# Patient Record
Sex: Male | Born: 1960 | Race: Black or African American | Hispanic: No | Marital: Married | Smoking: Current every day smoker
Health system: Southern US, Community
[De-identification: ages and names within clinical notes are randomized; demographics above are authoritative.]

## PROBLEM LIST (undated history)

## (undated) DIAGNOSIS — M1990 Unspecified osteoarthritis, unspecified site: Secondary | ICD-10-CM

## (undated) DIAGNOSIS — Z8739 Personal history of other diseases of the musculoskeletal system and connective tissue: Secondary | ICD-10-CM

## (undated) DIAGNOSIS — I10 Essential (primary) hypertension: Secondary | ICD-10-CM

## (undated) DIAGNOSIS — M19071 Primary osteoarthritis, right ankle and foot: Secondary | ICD-10-CM

## (undated) DIAGNOSIS — K219 Gastro-esophageal reflux disease without esophagitis: Secondary | ICD-10-CM

## (undated) DIAGNOSIS — J45909 Unspecified asthma, uncomplicated: Secondary | ICD-10-CM

## (undated) DIAGNOSIS — I4891 Unspecified atrial fibrillation: Secondary | ICD-10-CM

## (undated) DIAGNOSIS — Z8709 Personal history of other diseases of the respiratory system: Secondary | ICD-10-CM

## (undated) DIAGNOSIS — Z969 Presence of functional implant, unspecified: Secondary | ICD-10-CM

## (undated) HISTORY — PX: CLOSED REDUCTION FOOT DISLOCATION: SHX954

## (undated) HISTORY — DX: Unspecified atrial fibrillation: I48.91

## (undated) HISTORY — DX: Personal history of other diseases of the respiratory system: Z87.09

## (undated) HISTORY — PX: FOOT SURGERY: SHX648

## (undated) HISTORY — PX: NOSE SURGERY: SHX723

---

## 1988-06-01 HISTORY — PX: ORIF FOOT FRACTURE: SHX2123

## 2015-06-02 HISTORY — PX: FOOT HARDWARE REMOVAL: SHX1661

## 2016-09-09 ENCOUNTER — Other Ambulatory Visit: Payer: Self-pay | Admitting: Orthopedic Surgery

## 2016-09-09 DIAGNOSIS — Z981 Arthrodesis status: Secondary | ICD-10-CM

## 2016-09-14 ENCOUNTER — Other Ambulatory Visit: Payer: Self-pay

## 2016-09-15 ENCOUNTER — Other Ambulatory Visit: Payer: Self-pay

## 2016-09-16 ENCOUNTER — Ambulatory Visit
Admission: RE | Admit: 2016-09-16 | Discharge: 2016-09-16 | Disposition: A | Discharge status: Home / Self Care | Payer: Medicare Other | Source: Ambulatory Visit | Attending: Orthopedic Surgery | Admitting: Orthopedic Surgery

## 2016-09-16 DIAGNOSIS — Z981 Arthrodesis status: Secondary | ICD-10-CM

## 2016-09-29 DIAGNOSIS — M19071 Primary osteoarthritis, right ankle and foot: Secondary | ICD-10-CM

## 2016-09-29 DIAGNOSIS — Z969 Presence of functional implant, unspecified: Secondary | ICD-10-CM

## 2016-09-29 HISTORY — DX: Presence of functional implant, unspecified: Z96.9

## 2016-09-29 HISTORY — DX: Primary osteoarthritis, right ankle and foot: M19.071

## 2016-10-21 ENCOUNTER — Other Ambulatory Visit: Payer: Self-pay | Admitting: Orthopedic Surgery

## 2016-10-23 ENCOUNTER — Encounter (HOSPITAL_BASED_OUTPATIENT_CLINIC_OR_DEPARTMENT_OTHER): Payer: Self-pay | Admitting: *Deleted

## 2016-10-23 NOTE — Pre-Procedure Instructions (Signed)
No EKG performed at [facility], per fax.

## 2016-10-23 NOTE — Pre-Procedure Instructions (Signed)
[facility]

## 2016-10-23 NOTE — Progress Notes (Signed)
   10/23/16 1348  OBSTRUCTIVE SLEEP APNEA  Have you ever been diagnosed with sleep apnea through a sleep study? No  Do you snore loudly (loud enough to be heard through closed doors)?  1  Do you often feel tired, fatigued, or sleepy during the daytime (such as falling asleep during driving or talking to someone)? 0  Has anyone observed you stop breathing during your sleep? 0  Do you have, or are you being treated for high blood pressure? 1  BMI more than 35 kg/m2? 1  Age > 50 (1-yes) 1  Male Gender (Yes=1) 1  Obstructive Sleep Apnea Score 5

## 2016-10-29 ENCOUNTER — Encounter (HOSPITAL_BASED_OUTPATIENT_CLINIC_OR_DEPARTMENT_OTHER)
Admission: RE | Disposition: A | Discharge status: Home / Self Care | Payer: Self-pay | Source: Ambulatory Visit | Attending: Orthopedic Surgery

## 2016-10-29 ENCOUNTER — Encounter (HOSPITAL_BASED_OUTPATIENT_CLINIC_OR_DEPARTMENT_OTHER): Payer: Self-pay | Admitting: *Deleted

## 2016-10-29 ENCOUNTER — Ambulatory Visit (HOSPITAL_BASED_OUTPATIENT_CLINIC_OR_DEPARTMENT_OTHER)
Admission: RE | Admit: 2016-10-29 | Discharge: 2016-10-30 | Disposition: A | Discharge status: Home / Self Care | Payer: Medicare Other | Source: Ambulatory Visit | Attending: Orthopedic Surgery | Admitting: Orthopedic Surgery

## 2016-10-29 ENCOUNTER — Ambulatory Visit (HOSPITAL_BASED_OUTPATIENT_CLINIC_OR_DEPARTMENT_OTHER): Payer: Medicare Other | Admitting: Certified Registered"

## 2016-10-29 DIAGNOSIS — M19071 Primary osteoarthritis, right ankle and foot: Secondary | ICD-10-CM | POA: Diagnosis not present

## 2016-10-29 DIAGNOSIS — I10 Essential (primary) hypertension: Secondary | ICD-10-CM | POA: Diagnosis not present

## 2016-10-29 DIAGNOSIS — T8484XA Pain due to internal orthopedic prosthetic devices, implants and grafts, initial encounter: Secondary | ICD-10-CM | POA: Diagnosis present

## 2016-10-29 DIAGNOSIS — Z87891 Personal history of nicotine dependence: Secondary | ICD-10-CM | POA: Insufficient documentation

## 2016-10-29 DIAGNOSIS — Z9889 Other specified postprocedural states: Secondary | ICD-10-CM

## 2016-10-29 DIAGNOSIS — Z79899 Other long term (current) drug therapy: Secondary | ICD-10-CM | POA: Diagnosis not present

## 2016-10-29 DIAGNOSIS — J45909 Unspecified asthma, uncomplicated: Secondary | ICD-10-CM | POA: Diagnosis not present

## 2016-10-29 DIAGNOSIS — K219 Gastro-esophageal reflux disease without esophagitis: Secondary | ICD-10-CM | POA: Diagnosis not present

## 2016-10-29 HISTORY — DX: Personal history of other diseases of the musculoskeletal system and connective tissue: Z87.39

## 2016-10-29 HISTORY — DX: Gastro-esophageal reflux disease without esophagitis: K21.9

## 2016-10-29 HISTORY — DX: Presence of functional implant, unspecified: Z96.9

## 2016-10-29 HISTORY — DX: Unspecified osteoarthritis, unspecified site: M19.90

## 2016-10-29 HISTORY — PX: FOOT ARTHRODESIS: SHX1655

## 2016-10-29 HISTORY — DX: Essential (primary) hypertension: I10

## 2016-10-29 HISTORY — DX: Primary osteoarthritis, right ankle and foot: M19.071

## 2016-10-29 HISTORY — DX: Unspecified asthma, uncomplicated: J45.909

## 2016-10-29 HISTORY — PX: HARDWARE REMOVAL: SHX979

## 2016-10-29 SURGERY — REMOVAL, HARDWARE
Anesthesia: General | Site: Foot | Laterality: Right

## 2016-10-29 MED ORDER — ONDANSETRON HCL 4 MG/2ML IJ SOLN
INTRAMUSCULAR | Status: DC | PRN
  Administered 2016-10-29: 4 mg via INTRAVENOUS

## 2016-10-29 MED ORDER — FENTANYL CITRATE (PF) 100 MCG/2ML IJ SOLN
INTRAMUSCULAR | Status: AC
  Filled 2016-10-29: qty 2

## 2016-10-29 MED ORDER — CEFAZOLIN SODIUM-DEXTROSE 2-4 GM/100ML-% IV SOLN
INTRAVENOUS | Status: AC
  Filled 2016-10-29: qty 100

## 2016-10-29 MED ORDER — ALBUTEROL SULFATE HFA 108 (90 BASE) MCG/ACT IN AERS
2.0000 | INHALATION_SPRAY | RESPIRATORY_TRACT | Status: DC | PRN
  Administered 2016-10-29: 2 via RESPIRATORY_TRACT

## 2016-10-29 MED ORDER — DEXAMETHASONE SODIUM PHOSPHATE 10 MG/ML IJ SOLN
INTRAMUSCULAR | Status: DC | PRN
  Administered 2016-10-29: 10 mg via INTRAVENOUS

## 2016-10-29 MED ORDER — SODIUM CHLORIDE 0.9 % IV SOLN
INTRAVENOUS | Status: DC
  Administered 2016-10-29: 13:00:00 via INTRAVENOUS

## 2016-10-29 MED ORDER — OXYCODONE HCL 5 MG PO TABS
5.0000 mg | ORAL_TABLET | ORAL | Status: DC | PRN
  Administered 2016-10-29 – 2016-10-30 (×7): 10 mg via ORAL
  Filled 2016-10-29 (×7): qty 2

## 2016-10-29 MED ORDER — MIDAZOLAM HCL 2 MG/2ML IJ SOLN
1.0000 mg | INTRAMUSCULAR | Status: DC | PRN
  Administered 2016-10-29: 2 mg via INTRAVENOUS

## 2016-10-29 MED ORDER — DOCUSATE SODIUM 100 MG PO CAPS
100.0000 mg | ORAL_CAPSULE | Freq: Two times a day (BID) | ORAL | Status: DC
  Filled 2016-10-29: qty 1

## 2016-10-29 MED ORDER — FENTANYL CITRATE (PF) 100 MCG/2ML IJ SOLN
25.0000 ug | INTRAMUSCULAR | Status: DC | PRN
  Administered 2016-10-29: 50 ug via INTRAVENOUS

## 2016-10-29 MED ORDER — ONDANSETRON HCL 4 MG PO TABS: 4.0000 mg | ORAL_TABLET | Freq: Four times a day (QID) | ORAL | Status: DC | PRN

## 2016-10-29 MED ORDER — LIDOCAINE HCL (CARDIAC) 20 MG/ML IV SOLN
INTRAVENOUS | Status: DC | PRN
  Administered 2016-10-29: 60 mg via INTRAVENOUS

## 2016-10-29 MED ORDER — BUPIVACAINE-EPINEPHRINE (PF) 0.5% -1:200000 IJ SOLN
INTRAMUSCULAR | Status: AC
  Filled 2016-10-29: qty 30

## 2016-10-29 MED ORDER — DOCUSATE SODIUM 100 MG PO CAPS: 100.0000 mg | ORAL_CAPSULE | Freq: Two times a day (BID) | ORAL | 0 refills | Status: AC

## 2016-10-29 MED ORDER — SENNA 8.6 MG PO TABS
1.0000 | ORAL_TABLET | Freq: Two times a day (BID) | ORAL | Status: DC
  Filled 2016-10-29: qty 1

## 2016-10-29 MED ORDER — SENNA 8.6 MG PO TABS: 2.0000 | ORAL_TABLET | Freq: Two times a day (BID) | ORAL | 0 refills | Status: AC

## 2016-10-29 MED ORDER — MIDAZOLAM HCL 2 MG/2ML IJ SOLN
INTRAMUSCULAR | Status: AC
  Filled 2016-10-29: qty 2

## 2016-10-29 MED ORDER — MORPHINE SULFATE (PF) 4 MG/ML IV SOLN
INTRAVENOUS | Status: AC
  Filled 2016-10-29: qty 1

## 2016-10-29 MED ORDER — ASPIRIN EC 81 MG PO TBEC: 81.0000 mg | DELAYED_RELEASE_TABLET | Freq: Two times a day (BID) | ORAL | 0 refills | Status: DC

## 2016-10-29 MED ORDER — CHLORHEXIDINE GLUCONATE 4 % EX LIQD: 60.0000 mL | Freq: Once | CUTANEOUS | Status: DC

## 2016-10-29 MED ORDER — LACTATED RINGERS IV SOLN
INTRAVENOUS | Status: DC
  Administered 2016-10-29 (×2): via INTRAVENOUS

## 2016-10-29 MED ORDER — PROPOFOL 10 MG/ML IV BOLUS
INTRAVENOUS | Status: DC | PRN
  Administered 2016-10-29: 200 mg via INTRAVENOUS

## 2016-10-29 MED ORDER — ONDANSETRON HCL 4 MG/2ML IJ SOLN: 4.0000 mg | Freq: Four times a day (QID) | INTRAMUSCULAR | Status: DC | PRN

## 2016-10-29 MED ORDER — CEFAZOLIN SODIUM-DEXTROSE 2-4 GM/100ML-% IV SOLN
2.0000 g | INTRAVENOUS | Status: AC
  Administered 2016-10-29: 2 g via INTRAVENOUS

## 2016-10-29 MED ORDER — 0.9 % SODIUM CHLORIDE (POUR BTL) OPTIME
TOPICAL | Status: DC | PRN
  Administered 2016-10-29: 120 mL

## 2016-10-29 MED ORDER — ALBUTEROL SULFATE HFA 108 (90 BASE) MCG/ACT IN AERS
INHALATION_SPRAY | RESPIRATORY_TRACT | Status: AC
  Filled 2016-10-29: qty 6.7

## 2016-10-29 MED ORDER — MORPHINE SULFATE (PF) 2 MG/ML IV SOLN
2.0000 mg | INTRAVENOUS | Status: DC | PRN
  Administered 2016-10-29 (×2): 2 mg via INTRAVENOUS

## 2016-10-29 MED ORDER — PROMETHAZINE HCL 25 MG/ML IJ SOLN: 6.2500 mg | INTRAMUSCULAR | Status: DC | PRN

## 2016-10-29 MED ORDER — FENTANYL CITRATE (PF) 100 MCG/2ML IJ SOLN
50.0000 ug | INTRAMUSCULAR | Status: DC | PRN
  Administered 2016-10-29: 50 ug via INTRAVENOUS

## 2016-10-29 MED ORDER — OXYCODONE HCL 5 MG PO TABS: 5.0000 mg | ORAL_TABLET | ORAL | 0 refills | Status: DC | PRN

## 2016-10-29 MED ORDER — SODIUM CHLORIDE 0.9 % IV SOLN: INTRAVENOUS | Status: DC

## 2016-10-29 MED ORDER — BUPIVACAINE-EPINEPHRINE (PF) 0.5% -1:200000 IJ SOLN
INTRAMUSCULAR | Status: DC | PRN
  Administered 2016-10-29: 30 mL via PERINEURAL

## 2016-10-29 MED ORDER — SCOPOLAMINE 1 MG/3DAYS TD PT72: 1.0000 | MEDICATED_PATCH | Freq: Once | TRANSDERMAL | Status: DC | PRN

## 2016-10-29 SURGICAL SUPPLY — 78 items
BANDAGE ACE 4X5 VEL STRL LF (GAUZE/BANDAGES/DRESSINGS) IMPLANT
BANDAGE ESMARK 6X9 LF (GAUZE/BANDAGES/DRESSINGS) IMPLANT
BENZOIN TINCTURE PRP APPL 2/3 (GAUZE/BANDAGES/DRESSINGS) IMPLANT
BIT DRILL 4.8X200 CANN (BIT) ×4 IMPLANT
BIT DRILL 5/64X5 DISP (BIT) ×4 IMPLANT
BLADE AVERAGE 25MMX9MM (BLADE)
BLADE AVERAGE 25X9 (BLADE) IMPLANT
BLADE MICRO SAGITTAL (BLADE) IMPLANT
BLADE SURG 15 STRL LF DISP TIS (BLADE) ×6 IMPLANT
BLADE SURG 15 STRL SS (BLADE) ×6
BNDG COHESIVE 4X5 TAN STRL (GAUZE/BANDAGES/DRESSINGS) ×4 IMPLANT
BNDG COHESIVE 6X5 TAN STRL LF (GAUZE/BANDAGES/DRESSINGS) ×4 IMPLANT
BNDG ESMARK 4X9 LF (GAUZE/BANDAGES/DRESSINGS) IMPLANT
BNDG ESMARK 6X9 LF (GAUZE/BANDAGES/DRESSINGS)
BONE GRAFT AUGMENT 3CC (Orthopedic Implant) ×4 IMPLANT
CHLORAPREP W/TINT 26ML (MISCELLANEOUS) ×4 IMPLANT
CLOSURE WOUND 1/2 X4 (GAUZE/BANDAGES/DRESSINGS)
COVER BACK TABLE 60X90IN (DRAPES) ×4 IMPLANT
CUFF TOURNIQUET SINGLE 34IN LL (TOURNIQUET CUFF) ×4 IMPLANT
DECANTER SPIKE VIAL GLASS SM (MISCELLANEOUS) IMPLANT
DRAPE EXTREMITY T 121X128X90 (DRAPE) ×4 IMPLANT
DRAPE OEC MINIVIEW 54X84 (DRAPES) ×4 IMPLANT
DRAPE SURG 17X23 STRL (DRAPES) IMPLANT
DRAPE U-SHAPE 47X51 STRL (DRAPES) ×4 IMPLANT
DRSG MEPITEL 4X7.2 (GAUZE/BANDAGES/DRESSINGS) ×4 IMPLANT
DRSG PAD ABDOMINAL 8X10 ST (GAUZE/BANDAGES/DRESSINGS) ×8 IMPLANT
ELECT REM PT RETURN 9FT ADLT (ELECTROSURGICAL) ×4
ELECTRODE REM PT RTRN 9FT ADLT (ELECTROSURGICAL) ×2 IMPLANT
GAUZE SPONGE 4X4 12PLY STRL (GAUZE/BANDAGES/DRESSINGS) ×4 IMPLANT
GLOVE BIO SURGEON STRL SZ8 (GLOVE) ×4 IMPLANT
GLOVE BIOGEL PI IND STRL 7.0 (GLOVE) ×6 IMPLANT
GLOVE BIOGEL PI IND STRL 8 (GLOVE) ×4 IMPLANT
GLOVE BIOGEL PI INDICATOR 7.0 (GLOVE) ×6
GLOVE BIOGEL PI INDICATOR 8 (GLOVE) ×4
GLOVE ECLIPSE 6.5 STRL STRAW (GLOVE) ×8 IMPLANT
GLOVE ECLIPSE 8.0 STRL XLNG CF (GLOVE) ×4 IMPLANT
GOWN STRL REUS W/ TWL LRG LVL3 (GOWN DISPOSABLE) ×2 IMPLANT
GOWN STRL REUS W/ TWL XL LVL3 (GOWN DISPOSABLE) ×6 IMPLANT
GOWN STRL REUS W/TWL LRG LVL3 (GOWN DISPOSABLE) ×2
GOWN STRL REUS W/TWL XL LVL3 (GOWN DISPOSABLE) ×6
K-WIRE 9  SMOOTH .062 (WIRE) IMPLANT
NDL SAFETY ECLIPSE 18X1.5 (NEEDLE) IMPLANT
NEEDLE HYPO 18GX1.5 SHARP (NEEDLE)
NEEDLE HYPO 22GX1.5 SAFETY (NEEDLE) ×4 IMPLANT
PACK BASIN DAY SURGERY FS (CUSTOM PROCEDURE TRAY) ×4 IMPLANT
PAD CAST 4YDX4 CTTN HI CHSV (CAST SUPPLIES) ×2 IMPLANT
PADDING CAST ABS 4INX4YD NS (CAST SUPPLIES)
PADDING CAST ABS COTTON 4X4 ST (CAST SUPPLIES) IMPLANT
PADDING CAST COTTON 4X4 STRL (CAST SUPPLIES) ×2
PADDING CAST COTTON 6X4 STRL (CAST SUPPLIES) ×4 IMPLANT
PENCIL BUTTON HOLSTER BLD 10FT (ELECTRODE) ×4 IMPLANT
PIN GUIDE DRILL TIP 2.8X300 (DRILL) ×8 IMPLANT
SANITIZER HAND PURELL 535ML FO (MISCELLANEOUS) ×4 IMPLANT
SCREW CANN FT 95X8 NS LNG (Screw) ×2 IMPLANT
SCREW CANNULATED 8.0X95 (Screw) ×2 IMPLANT
SHEET MEDIUM DRAPE 40X70 STRL (DRAPES) ×4 IMPLANT
SLEEVE SCD COMPRESS KNEE MED (MISCELLANEOUS) ×4 IMPLANT
SPLINT FAST PLASTER 5X30 (CAST SUPPLIES) ×40
SPLINT PLASTER CAST FAST 5X30 (CAST SUPPLIES) ×40 IMPLANT
SPONGE LAP 18X18 X RAY DECT (DISPOSABLE) ×4 IMPLANT
SPONGE SURGIFOAM ABS GEL 12-7 (HEMOSTASIS) IMPLANT
STOCKINETTE 6  STRL (DRAPES) ×2
STOCKINETTE 6 STRL (DRAPES) ×2 IMPLANT
STRIP CLOSURE SKIN 1/2X4 (GAUZE/BANDAGES/DRESSINGS) IMPLANT
SUCTION FRAZIER HANDLE 10FR (MISCELLANEOUS) ×2
SUCTION TUBE FRAZIER 10FR DISP (MISCELLANEOUS) ×2 IMPLANT
SUT ETHILON 3 0 PS 1 (SUTURE) ×8 IMPLANT
SUT MNCRL AB 3-0 PS2 18 (SUTURE) ×4 IMPLANT
SUT VIC AB 0 SH 27 (SUTURE) IMPLANT
SUT VIC AB 2-0 SH 27 (SUTURE) ×2
SUT VIC AB 2-0 SH 27XBRD (SUTURE) ×2 IMPLANT
SYR BULB 3OZ (MISCELLANEOUS) ×4 IMPLANT
SYR CONTROL 10ML LL (SYRINGE) IMPLANT
Screw Cannulated 8.0 x 105 MM (Screw) ×4 IMPLANT
TOWEL OR 17X24 6PK STRL BLUE (TOWEL DISPOSABLE) ×8 IMPLANT
TUBE CONNECTING 20'X1/4 (TUBING) ×1
TUBE CONNECTING 20X1/4 (TUBING) ×3 IMPLANT
UNDERPAD 30X30 (UNDERPADS AND DIAPERS) ×4 IMPLANT

## 2016-10-29 NOTE — Transfer of Care (Signed)
Immediate Anesthesia Transfer of Care Note  Patient: Frank May  Procedure(s) Performed: Procedure(s): Right ankle removal of deep implant (Right) Right subtalar arthrodesis (Right)  Patient Location: PACU  Anesthesia Type:GA combined with regional for post-op pain  Level of Consciousness: awake, alert , oriented and patient cooperative  Airway & Oxygen Therapy: Patient Spontanous Breathing and Patient connected to face mask oxygen  Post-op Assessment: Report given to RN and Post -op Vital signs reviewed and stable  Post vital signs: Reviewed and stable  Last Vitals:  Vitals:   10/29/16 0914 10/29/16 0915  BP:    Pulse: (!) 57 (!) 57  Resp: 13 13  Temp:      Last Pain:  Vitals:   10/29/16 0804  TempSrc: Oral  PainSc: 5       Patients Stated Pain Goal: 3 (10/29/16 0804)  Complications: No apparent anesthesia complications

## 2016-10-29 NOTE — H&P (Signed)
Frank CadetCharles A May is an 56 y.o. male.   Chief Complaint:  Right foot pain HPI:  56 y/o male with h/o right ankle and subtalar arthrodes[place] in the [place] past in [place]  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  He has failed non op treatment to date and presents today for removal of the hardware and revision subtalar arthrodesis.   Past Medical History:  Diagnosis Date  . Arthritis    right knee, right hip, right shoulder  . 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 Asthma   . GERD (gastroesophageal reflux disease)   . History of gout    right knee  . Hypertension    states under control with med., has been on med. > 20 yr.  . 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 CLOSED REDUCTION FOOT DISLOCATION Left   . FOOT HARDWARE REMOVAL Right 2017  . FOOT SURGERY Right    5-6 x  . NOSE SURGERY     x 3  . ORIF FOOT FRACTURE Right 1990    History reviewed. No pertinent family history. Social History:  reports that he quit smoking about 2 weeks ago. He has never used smokeless tobacco. He reports that he drinks alcohol. He reports that he does not use drugs.  Allergies: No Known Allergies  Medications Prior to Admission  Medication Sig Dispense Refill  . albuterol (PROVENTIL HFA;VENTOLIN HFA) 108 (90 Base) MCG/ACT inhaler Inhale into the lungs every 6 (six) hours as needed for wheezing or shortness of breath.    . allopurinol (ZYLOPRIM) 100 MG tablet Take 100 mg by mouth daily.    Marland Kitchen. HYDROcodone-acetaminophen (NORCO) 10-325 MG tablet Take 1 tablet by mouth every 6 (six) hours as needed.    Marland Kitchen. lisinopril (PRINIVIL,ZESTRIL) 10 MG tablet Take 12.5 mg by mouth daily.    . ranitidine (ZANTAC) 150 MG tablet Take 150 mg by mouth daily.      No results found for this or any previous visit (from the past 48 hour(s)). No results found.  ROS  No recent f/c/n/v/wt loss  Blood pressure 127/78,  pulse (!) 57, temperature 97.8 F (36.6 C), temperature source Oral, resp. rate 13, height 5' 9.5" (1.765 m), weight 124.3 kg (274 lb), SpO2 100 %. Physical Exam  wn wd male in nad.  A and O x 4.  Mood and affect normal.  EOMI.  resp unlabored.  R foot with healed surgical incisions.  No signs of infection.  No lymphadenopathy.  TTP at the sinus tarsi.  Intact sens to LT at the dorsal and plantar foot.  Assessment/Plan R subtalar nonunion and retained hardware - to OR for removal of implants and revision subtalar arthrodesis.  The risks and benefits of the alternative treatment options have been discussed in detail.  The patient wishes to proceed with surgery and specifically understands risks of bleeding, infection, nerve damage, blood clots, need for additional surgery, amputation and death.   Toni ArthursHEWITT, Kyani Simkin, MD 10/29/2016, 9:40 AM

## 2016-10-29 NOTE — Anesthesia Postprocedure Evaluation (Signed)
Anesthesia Post Note  Patient: Jacqulynn CadetCharles A Dobesh  Procedure(s) Performed: Procedure(s) (LRB): Right ankle removal of deep implant (Right) Right subtalar arthrodesis (Right)     Patient location during evaluation: PACU Anesthesia Type: General Level of consciousness: awake and alert Pain management: pain level controlled Vital Signs Assessment: post-procedure vital signs reviewed and stable Respiratory status: spontaneous breathing, nonlabored ventilation, respiratory function stable and patient connected to nasal cannula oxygen Cardiovascular status: blood pressure returned to baseline and stable Postop Assessment: no signs of nausea or vomiting Anesthetic complications: no    Last Vitals:  Vitals:   10/29/16 1255 10/29/16 1400  BP: 132/86 114/74  Pulse: 62 69  Resp: 18 18  Temp: 36.1 C     Last Pain:  Vitals:   10/29/16 1400  TempSrc:   PainSc: 5                  Cecile HearingStephen Edward Gahel Safley

## 2016-10-29 NOTE — Discharge Instructions (Signed)
Frank ArthursJohn Hewitt, MD [facility]  Please read the following information regarding your care after surgery.  Medications  You only need a prescription for the narcotic pain medicine (ex. oxycodone, Percocet, Norco).  All of the other medicines listed below are available over the counter. X acetominophen (Tylenol) 650 mg every 4-6 hours as you need for minor pain X oxycodone as prescribed for moderate to severe pain X aleve 220 mg - 2 aleve twice daily with food as needed for pain   Narcotic pain medicine (ex. oxycodone, Percocet, Vicodin) will cause constipation.  To prevent this problem, take the following medicines while you are taking any pain medicine. X docusate sodium (Colace) 100 mg twice a day X senna (Senokot) 2 tablets twice a day  X To help prevent blood clots, take a baby aspirin (81 mg) twice a day after surgery.  You should also get up every hour while you are awake to move around.    Weight Bearing X Do not bear any weight on the operated leg or foot.  Cast / Splint / Dressing X Keep your splint or cast clean and dry.  Dont put anything (coat hanger, pencil, etc) down inside of it.  If it gets damp, use a hair dryer on the cool setting to dry it.  If it gets soaked, call the office to schedule an appointment for a cast change.   After your dressing, cast or splint is removed; you may shower, but do not soak or scrub the wound.  Allow the water to run over it, and then gently pat it dry.  Swelling It is normal for you to have swelling where you had surgery.  To reduce swelling and pain, keep your toes above your nose for at least 3 days after surgery.  It may be necessary to keep your foot or leg elevated for several weeks.  If it hurts, it should be elevated.  Follow Up Call my office at [phone] when you are discharged from the hospital or surgery center to schedule an appointment to be seen two weeks after surgery.  Call my office at [phone] if you  develop a fever >101.5 F, nausea, vomiting, bleeding from the surgical site or severe pain.

## 2016-10-29 NOTE — Progress Notes (Signed)
Assisted Dr. Turk with right, ultrasound guided, popliteal/saphenous block. Side rails up, monitors on throughout procedure. See vital signs in flow sheet. Tolerated Procedure well. 

## 2016-10-29 NOTE — Anesthesia Procedure Notes (Signed)
Anesthesia Regional Block: Popliteal block   Pre-Anesthetic Checklist: ,, timeout performed, Correct Patient, Correct Site, Correct Laterality, Correct Procedure, Correct Position, site marked, Risks and benefits discussed,  Surgical consent,  Pre-op evaluation,  At surgeon's request and post-op pain management  Laterality: Right  Prep: chloraprep       Needles:  Injection technique: Single-shot  Needle Type: Echogenic Needle     Needle Length: 9cm  Needle Gauge: 21     Additional Needles:   Procedures: ultrasound guided,,,,,,,,  Narrative:  Start time: 10/29/2016 9:08 AM End time: 10/29/2016 9:13 AM Injection made incrementally with aspirations every 5 mL.  Performed by: Personally  Anesthesiologist: Cecile HearingURK, STEPHEN EDWARD  Additional Notes: No pain on injection. No increased resistance to injection. Injection made in 5cc increments.  Good needle visualization.  Patient tolerated procedure well.  Combined right popliteal/saphenous nerve block.

## 2016-10-29 NOTE — Brief Op Note (Signed)
10/29/2016  11:49 AM  PATIENT:  Frank May  56 y.o. male  PRE-OPERATIVE DIAGNOSIS: 1.  Painful hardware right ankle  S/p TTC nail      2.  Right subtalar arthritis  POST-OPERATIVE DIAGNOSIS: same  Procedure(s): 1.  Removal of deep implants right tibia 2.  Removal of deep implant right calcaneus (separate incision) 3.  Right subtalar arthrodesis 4.  AP and lateral xrays of the right ankle 5.  AP, lateral and Harris heel xrays of the right foot  SURGEON:  Toni ArthursJohn Skylor Schnapp, MD  ASSISTANT:  Alfredo MartinezJustin Ollis, PA-C  ANESTHESIA:   General, regional  EBL:  minimal   TOURNIQUET:   Total Tourniquet Time Documented: Thigh (Right) - 76 minutes Total: Thigh (Right) - 76 minutes  COMPLICATIONS:  None apparent  DISPOSITION:  Extubated, awake and stable to recovery.  DICTATION ID:  [id number]

## 2016-10-29 NOTE — Anesthesia Preprocedure Evaluation (Signed)
Anesthesia Evaluation  Patient identified by MRN, date of birth, ID band Patient awake    Reviewed: Allergy & Precautions, NPO status , Patient's Chart, lab work & pertinent test results  Airway Mallampati: II  TM Distance: >3 FB Neck ROM: Full    Dental  (+) Teeth Intact, Dental Advisory Given   Pulmonary asthma , former smoker,    Pulmonary exam normal breath sounds clear to auscultation       Cardiovascular hypertension, Pt. on medications Normal cardiovascular exam Rhythm:Regular Rate:Normal     Neuro/Psych negative neurological ROS     GI/Hepatic Neg liver ROS, GERD  Medicated,  Endo/Other  negative endocrine ROSObesity   Renal/GU negative Renal ROS     Musculoskeletal  (+) Arthritis , Osteoarthritis,    Abdominal   Peds  Hematology negative hematology ROS (+)   Anesthesia Other Findings Day of surgery medications reviewed with the patient.  Reproductive/Obstetrics                             Anesthesia Physical Anesthesia Plan  ASA: III  Anesthesia Plan: General   Post-op Pain Management:  Regional for Post-op pain   Induction: Intravenous  Airway Management Planned: LMA  Additional Equipment:   Intra-op Plan:   Post-operative Plan: Extubation in OR  Informed Consent: I have reviewed the patients History and Physical, chart, labs and discussed the procedure including the risks, benefits and alternatives for the proposed anesthesia with the patient or authorized representative who has indicated his/her understanding and acceptance.   Dental advisory given  Plan Discussed with: CRNA  Anesthesia Plan Comments: (Risks/benefits of general anesthesia discussed with patient including risk of damage to teeth, lips, gum, and tongue, nausea/vomiting, allergic reactions to medications, and the possibility of heart attack, stroke and death.  All patient questions answered.   Patient wishes to proceed.)        Anesthesia Quick Evaluation

## 2016-10-29 NOTE — Anesthesia Procedure Notes (Signed)
Procedure Name: LMA Insertion Date/Time: 10/29/2016 10:08 AM Performed by: Jahrell Hamor D Pre-anesthesia Checklist: Patient identified, Emergency Drugs available, Suction available and Patient being monitored Patient Re-evaluated:Patient Re-evaluated prior to inductionOxygen Delivery Method: Circle system utilized Preoxygenation: Pre-oxygenation with 100% oxygen Intubation Type: IV induction Ventilation: Mask ventilation without difficulty LMA: LMA inserted LMA Size: 4.0 Number of attempts: 1 Airway Equipment and Method: Bite block Placement Confirmation: positive ETCO2 Tube secured with: Tape Dental Injury: Teeth and Oropharynx as per pre-operative assessment

## 2016-10-30 ENCOUNTER — Encounter (HOSPITAL_BASED_OUTPATIENT_CLINIC_OR_DEPARTMENT_OTHER): Payer: Self-pay | Admitting: Orthopedic Surgery

## 2016-10-30 DIAGNOSIS — T8484XA Pain due to internal orthopedic prosthetic devices, implants and grafts, initial encounter: Secondary | ICD-10-CM | POA: Diagnosis not present

## 2016-10-30 NOTE — Op Note (Signed)
NAME:  Frank May, Frank May              ACCOUNT NO.:  [id number]  MEDICAL RECORD NO.:  [id number]  LOCATION:                                 FACILITY:  PHYSICIAN:  Toni ArthursJohn Romelle Reiley, MD             DATE OF BIRTH:  DATE OF PROCEDURE:  10/29/2016 DATE OF DISCHARGE:                              OPERATIVE REPORT   PREOPERATIVE DIAGNOSES: 1. Painful hardware, right ankle; status post tibiotalocalcaneal     nailing. 2. Right subtalar arthritis.  POSTOPERATIVE DIAGNOSES: 1. Painful hardware, right ankle; status post tibiotalocalcaneal     nailing. 2. Right subtalar arthritis.  PROCEDURE: 1. Removal of deep implants from the right tibia. 2. Removal of deep implants from the right calcaneus through a     separate incision. 3. Right subtalar arthrodesis through a separate incision. 4. AP and lateral radiographs of the right ankle. 5. AP, lateral and Harris heel radiographs of the right foot.  SURGEON:  Toni ArthursJohn Chiana Wamser, MD.  ASSISTANT:  Alfredo MartinezJustin Ollis, PA-C.  ANESTHESIA:  General, regional.  ESTIMATED BLOOD LOSS:  Minimal.  TOURNIQUET TIME:  76 minutes at 250 mmHg.  COMPLICATIONS:  None apparent.  DISPOSITION:  Extubated, awake, and stable to recovery.  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  He underwent TTC nailing for this condition while living in [place] several years ago.  He has continued to have pain around the ankle.  CT scan shows solid fusion of the ankle joint with subtalar joint has not healed and appears to have considerable arthritis.  He presents today for removal of the painful implants and arthrodesis of the subtalar joint.  He understands the risks and benefits of the alternative treatment options and elects surgical treatment.  He specifically understands risks of bleeding, infection, nerve damage, blood clots, need for  additional surgery, continued pain, nonunion, amputation, and death.  He understands that smoking increases his risk of nonunion, blood clots, and infection.  He reports that he has quit smoking several weeks ago.  PROCEDURE IN DETAIL:  After preoperative consent was obtained and the correct operative site was identified, the patient was brought to the operating room and placed supine on the operating table.  General anesthesia was induced.  Preoperative antibiotics were administered. Surgical time-out was taken.  The right lower extremity was prepped and draped in standard sterile fashion with a tourniquet around the thigh. The extremity was exsanguinated and the tourniquet was inflated to 250 mmHg.  The medial screw heads were identified.  The skin was incised over the proximal screw.  Dissection was carried down through the subcutaneous tissues, and the screw was cleared of all soft tissue. Screw was removed in its entirety.  The more distal screw was removed in the same fashion.  Both wounds were irrigated and closed with nylon.  Attention was then turned to the plantar aspect of the foot.  A longitudinal incision was made at the insertion point of the nail. Dissection was carried down bluntly through the subcutaneous tissues to the plantar  aspect of the calcaneus.  Curettes were used to open the cortical bone at the site of the nail.  An extraction T-handle was inserted into the nail and threaded into place.  The nail was rotated to break it free of all soft tissues within the medullary canal.  Slap hammer was then attached.  The nail was then removed in its entirety. Wound was then irrigated copiously including the medullary canal.  Nylon sutures were used to close the skin incision.  Attention was then turned to the lateral aspect of the hindfoot.  A sinus tarsi incision was made and dissection was carried down through the skin and subcutaneous tissues.  Peroneal tendons were  identified and were protected.  Subtalar joint was opened.  There were degenerative changes noted, but cartilage was still present on both sides of the joint.  The joint was opened with lamina spreader and all the remaining cartilage and subchondral bone was removed with curettes and rongeurs. Wound was irrigated copiously.  A 3.5-mm drill bit was used to perforate subchondral bone on both sides of the joint leaving the resultant bone graft in place.  Augment bone graft substitute from [facility] was then inserted into the joint.  Joint was reduced.  Two 8.0 mm Biomet partially-threaded cannulated screws were then inserted across the subtalar joint.  Both were noted to have excellent purchase and compressed the arthrodesis site appropriately.  AP ankle, AP foot, lateral ankle, lateral foot, and Harris heel radiographs were obtained showing appropriate position and length of all hardware and appropriate reduction of the subtalar joint.  The lateral wound was then closed with Vicryl, Monocryl, and nylon.  The posterior incision was closed with nylon.  Sterile dressings were applied, followed by well-padded short- leg splint.  Tourniquet was released after application of the dressings at 76 minutes.  The patient was awakened from anesthesia and transported to the recovery room in stable condition.  FOLLOWUP PLAN:  The patient will be nonweightbearing on the right lower extremity.  He will follow up with me in the office in 2 weeks for suture removal.  He will take aspirin for DVT prophylaxis.  He will be observed overnight for pain control.  Alfredo Martinez, PA-C, was present and scrubbed for the duration of the case.  His assistance was essential in positioning the patient, prepping and draping, gaining and maintaining exposure, performing the operation, closing and dressing the wounds, and applying the splint.  RADIOGRAPHS:  AP foot, lateral foot, and Harris heel radiographs  showed appropriate position and length of all hardware and appropriate arthrodesis of the subtalar joint.  AP ankle and lateral ankle radiographs confirmed appropriate position of the screws and solid fusion of the tibiotalar joint.  No other acute injuries are noted.     Toni Arthurs, MD     JH/MEDQ  D:  10/29/2016  T:  10/29/2016  Job:  [id number]

## 2018-07-18 ENCOUNTER — Encounter (HOSPITAL_COMMUNITY): Payer: Self-pay

## 2018-07-18 ENCOUNTER — Other Ambulatory Visit: Payer: Self-pay

## 2018-07-18 ENCOUNTER — Inpatient Hospital Stay (HOSPITAL_COMMUNITY)
Admission: EM | Admit: 2018-07-18 | Discharge: 2018-07-20 | DRG: 193 | Disposition: A | Discharge status: Home / Self Care | Payer: Medicare Other | Attending: Internal Medicine | Admitting: Internal Medicine

## 2018-07-18 ENCOUNTER — Emergency Department (HOSPITAL_COMMUNITY): Payer: Medicare Other

## 2018-07-18 ENCOUNTER — Emergency Department (HOSPITAL_COMMUNITY)
Admission: EM | Admit: 2018-07-18 | Discharge: 2018-07-18 | Discharge status: Against Medical Advice | Payer: Medicare Other | Source: Home / Self Care | Attending: Emergency Medicine | Admitting: Emergency Medicine

## 2018-07-18 DIAGNOSIS — E876 Hypokalemia: Secondary | ICD-10-CM | POA: Diagnosis present

## 2018-07-18 DIAGNOSIS — I4891 Unspecified atrial fibrillation: Secondary | ICD-10-CM

## 2018-07-18 DIAGNOSIS — J449 Chronic obstructive pulmonary disease, unspecified: Secondary | ICD-10-CM | POA: Diagnosis not present

## 2018-07-18 DIAGNOSIS — F17200 Nicotine dependence, unspecified, uncomplicated: Secondary | ICD-10-CM | POA: Diagnosis present

## 2018-07-18 DIAGNOSIS — R109 Unspecified abdominal pain: Secondary | ICD-10-CM

## 2018-07-18 DIAGNOSIS — Z7982 Long term (current) use of aspirin: Secondary | ICD-10-CM

## 2018-07-18 DIAGNOSIS — J45909 Unspecified asthma, uncomplicated: Secondary | ICD-10-CM | POA: Insufficient documentation

## 2018-07-18 DIAGNOSIS — Z5329 Procedure and treatment not carried out because of patient's decision for other reasons: Secondary | ICD-10-CM

## 2018-07-18 DIAGNOSIS — G8929 Other chronic pain: Secondary | ICD-10-CM | POA: Diagnosis present

## 2018-07-18 DIAGNOSIS — Z79899 Other long term (current) drug therapy: Secondary | ICD-10-CM

## 2018-07-18 DIAGNOSIS — M1611 Unilateral primary osteoarthritis, right hip: Secondary | ICD-10-CM | POA: Diagnosis present

## 2018-07-18 DIAGNOSIS — Z9119 Patient's noncompliance with other medical treatment and regimen: Secondary | ICD-10-CM | POA: Diagnosis not present

## 2018-07-18 DIAGNOSIS — J189 Pneumonia, unspecified organism: Secondary | ICD-10-CM

## 2018-07-18 DIAGNOSIS — J440 Chronic obstructive pulmonary disease with acute lower respiratory infection: Secondary | ICD-10-CM | POA: Diagnosis present

## 2018-07-18 DIAGNOSIS — E785 Hyperlipidemia, unspecified: Secondary | ICD-10-CM | POA: Diagnosis present

## 2018-07-18 DIAGNOSIS — J181 Lobar pneumonia, unspecified organism: Secondary | ICD-10-CM

## 2018-07-18 DIAGNOSIS — I480 Paroxysmal atrial fibrillation: Secondary | ICD-10-CM | POA: Diagnosis present

## 2018-07-18 DIAGNOSIS — R0602 Shortness of breath: Secondary | ICD-10-CM

## 2018-07-18 DIAGNOSIS — Z87891 Personal history of nicotine dependence: Secondary | ICD-10-CM | POA: Insufficient documentation

## 2018-07-18 DIAGNOSIS — I10 Essential (primary) hypertension: Secondary | ICD-10-CM | POA: Insufficient documentation

## 2018-07-18 DIAGNOSIS — Z5320 Procedure and treatment not carried out because of patient's decision for unspecified reasons: Secondary | ICD-10-CM

## 2018-07-18 DIAGNOSIS — M109 Gout, unspecified: Secondary | ICD-10-CM | POA: Diagnosis present

## 2018-07-18 DIAGNOSIS — N179 Acute kidney failure, unspecified: Secondary | ICD-10-CM | POA: Diagnosis present

## 2018-07-18 DIAGNOSIS — I5021 Acute systolic (congestive) heart failure: Secondary | ICD-10-CM | POA: Diagnosis present

## 2018-07-18 DIAGNOSIS — M19011 Primary osteoarthritis, right shoulder: Secondary | ICD-10-CM | POA: Diagnosis present

## 2018-07-18 DIAGNOSIS — A419 Sepsis, unspecified organism: Secondary | ICD-10-CM | POA: Insufficient documentation

## 2018-07-18 DIAGNOSIS — I509 Heart failure, unspecified: Secondary | ICD-10-CM

## 2018-07-18 DIAGNOSIS — I110 Hypertensive heart disease with heart failure: Secondary | ICD-10-CM | POA: Diagnosis present

## 2018-07-18 DIAGNOSIS — D509 Iron deficiency anemia, unspecified: Secondary | ICD-10-CM | POA: Diagnosis present

## 2018-07-18 DIAGNOSIS — J9621 Acute and chronic respiratory failure with hypoxia: Secondary | ICD-10-CM | POA: Diagnosis present

## 2018-07-18 DIAGNOSIS — M1711 Unilateral primary osteoarthritis, right knee: Secondary | ICD-10-CM | POA: Diagnosis present

## 2018-07-18 DIAGNOSIS — K219 Gastro-esophageal reflux disease without esophagitis: Secondary | ICD-10-CM | POA: Diagnosis present

## 2018-07-18 LAB — CBC WITH DIFFERENTIAL/PLATELET
Abs Immature Granulocytes: 0.05 10*3/uL (ref 0.00–0.07)
Basophils Absolute: 0 10*3/uL (ref 0.0–0.1)
Basophils Relative: 0 %
Eosinophils Absolute: 0 10*3/uL (ref 0.0–0.5)
Eosinophils Relative: 0 %
HCT: 37.3 % — ABNORMAL LOW (ref 39.0–52.0)
Hemoglobin: 12.4 g/dL — ABNORMAL LOW (ref 13.0–17.0)
Immature Granulocytes: 0 %
Lymphocytes Relative: 11 %
Lymphs Abs: 1.4 10*3/uL (ref 0.7–4.0)
MCH: 23.7 pg — ABNORMAL LOW (ref 26.0–34.0)
MCHC: 33.2 g/dL (ref 30.0–36.0)
MCV: 71.3 fL — ABNORMAL LOW (ref 80.0–100.0)
Monocytes Absolute: 0.7 10*3/uL (ref 0.1–1.0)
Monocytes Relative: 6 %
NEUTROS PCT: 83 %
Neutro Abs: 10.4 10*3/uL — ABNORMAL HIGH (ref 1.7–7.7)
Platelets: 133 10*3/uL — ABNORMAL LOW (ref 150–400)
RBC: 5.23 MIL/uL (ref 4.22–5.81)
RDW: 15.3 % (ref 11.5–15.5)
WBC: 12.6 10*3/uL — AB (ref 4.0–10.5)
nRBC: 0 % (ref 0.0–0.2)

## 2018-07-18 LAB — COMPREHENSIVE METABOLIC PANEL
ALBUMIN: 3 g/dL — AB (ref 3.5–5.0)
ALT: 32 U/L (ref 0–44)
AST: 72 U/L — ABNORMAL HIGH (ref 15–41)
Alkaline Phosphatase: 51 U/L (ref 38–126)
Anion gap: 14 (ref 5–15)
BUN: 16 mg/dL (ref 6–20)
CO2: 18 mmol/L — ABNORMAL LOW (ref 22–32)
Calcium: 8.2 mg/dL — ABNORMAL LOW (ref 8.9–10.3)
Chloride: 102 mmol/L (ref 98–111)
Creatinine, Ser: 1.49 mg/dL — ABNORMAL HIGH (ref 0.61–1.24)
GFR calc Af Amer: 59 mL/min — ABNORMAL LOW (ref >60)
GFR calc non Af Amer: 51 mL/min — ABNORMAL LOW (ref >60)
Glucose, Bld: 109 mg/dL — ABNORMAL HIGH (ref 70–99)
POTASSIUM: 3.7 mmol/L (ref 3.5–5.1)
Sodium: 134 mmol/L — ABNORMAL LOW (ref 135–145)
Total Bilirubin: 1.6 mg/dL — ABNORMAL HIGH (ref 0.3–1.2)
Total Protein: 8.3 g/dL — ABNORMAL HIGH (ref 6.5–8.1)

## 2018-07-18 LAB — INFLUENZA PANEL BY PCR (TYPE A & B)
Influenza A By PCR: NEGATIVE
Influenza B By PCR: NEGATIVE

## 2018-07-18 LAB — LIPASE, BLOOD: Lipase: 15 U/L (ref 11–51)

## 2018-07-18 LAB — BRAIN NATRIURETIC PEPTIDE: B Natriuretic Peptide: 1433.6 pg/mL — ABNORMAL HIGH (ref 0.0–100.0)

## 2018-07-18 LAB — MAGNESIUM: Magnesium: 2 mg/dL (ref 1.7–2.4)

## 2018-07-18 LAB — TROPONIN I
Troponin I: 0.05 ng/mL (ref <0.03)
Troponin I: 0.03 ng/mL (ref <0.03)

## 2018-07-18 LAB — LACTIC ACID, PLASMA
LACTIC ACID, VENOUS: 1.4 mmol/L (ref 0.5–1.9)
Lactic Acid, Venous: 1.3 mmol/L (ref 0.5–1.9)

## 2018-07-18 MED ORDER — METRONIDAZOLE IN NACL 5-0.79 MG/ML-% IV SOLN
500.0000 mg | Freq: Three times a day (TID) | INTRAVENOUS | Status: DC
  Filled 2018-07-18: qty 100

## 2018-07-18 MED ORDER — AMOXICILLIN-POT CLAVULANATE 875-125 MG PO TABS: 1.0000 | ORAL_TABLET | Freq: Two times a day (BID) | ORAL | 0 refills | Status: AC

## 2018-07-18 MED ORDER — SODIUM CHLORIDE 0.9 % IV SOLN
2.0000 g | Freq: Once | INTRAVENOUS | Status: AC
  Administered 2018-07-18: 2 g via INTRAVENOUS
  Filled 2018-07-18: qty 2

## 2018-07-18 MED ORDER — AZITHROMYCIN 250 MG PO TABS
500.0000 mg | ORAL_TABLET | Freq: Once | ORAL | Status: AC
  Administered 2018-07-18: 500 mg via ORAL
  Filled 2018-07-18: qty 2

## 2018-07-18 MED ORDER — ALLOPURINOL 100 MG PO TABS
100.0000 mg | ORAL_TABLET | Freq: Every day | ORAL | Status: DC
  Administered 2018-07-19 – 2018-07-20 (×2): 100 mg via ORAL
  Filled 2018-07-18 (×2): qty 1

## 2018-07-18 MED ORDER — VANCOMYCIN HCL 10 G IV SOLR
2500.0000 mg | Freq: Once | INTRAVENOUS | Status: AC
  Administered 2018-07-18: 2500 mg via INTRAVENOUS
  Filled 2018-07-18: qty 2500

## 2018-07-18 MED ORDER — ASPIRIN EC 81 MG PO TBEC
81.0000 mg | DELAYED_RELEASE_TABLET | Freq: Every day | ORAL | Status: DC
  Administered 2018-07-18 – 2018-07-20 (×3): 81 mg via ORAL
  Filled 2018-07-18 (×3): qty 1

## 2018-07-18 MED ORDER — DOXYCYCLINE HYCLATE 100 MG PO CAPS: 100.0000 mg | ORAL_CAPSULE | Freq: Two times a day (BID) | ORAL | 0 refills | Status: AC

## 2018-07-18 MED ORDER — HYDROCODONE-ACETAMINOPHEN 10-325 MG PO TABS
1.0000 | ORAL_TABLET | Freq: Four times a day (QID) | ORAL | Status: DC | PRN
  Administered 2018-07-18 – 2018-07-19 (×2): 1 via ORAL
  Filled 2018-07-18 (×2): qty 1

## 2018-07-18 MED ORDER — IPRATROPIUM-ALBUTEROL 0.5-2.5 (3) MG/3ML IN SOLN
3.0000 mL | Freq: Three times a day (TID) | RESPIRATORY_TRACT | Status: DC
  Administered 2018-07-19 (×2): 3 mL via RESPIRATORY_TRACT
  Filled 2018-07-18 (×2): qty 3

## 2018-07-18 MED ORDER — VANCOMYCIN HCL IN DEXTROSE 1-5 GM/200ML-% IV SOLN: 1000.0000 mg | Freq: Once | INTRAVENOUS | Status: DC

## 2018-07-18 MED ORDER — SODIUM CHLORIDE 0.9 % IV SOLN
1.0000 g | INTRAVENOUS | Status: DC
  Administered 2018-07-19: 1 g via INTRAVENOUS
  Filled 2018-07-18: qty 10

## 2018-07-18 MED ORDER — DILTIAZEM LOAD VIA INFUSION
20.0000 mg | Freq: Once | INTRAVENOUS | Status: DC
  Filled 2018-07-18: qty 20

## 2018-07-18 MED ORDER — FAMOTIDINE 20 MG PO TABS: 20.0000 mg | ORAL_TABLET | Freq: Every day | ORAL | Status: DC

## 2018-07-18 MED ORDER — APIXABAN 5 MG PO TABS
5.0000 mg | ORAL_TABLET | Freq: Two times a day (BID) | ORAL | Status: DC
  Administered 2018-07-19 – 2018-07-20 (×3): 5 mg via ORAL
  Filled 2018-07-18 (×5): qty 1

## 2018-07-18 MED ORDER — SODIUM CHLORIDE 0.9 % IV BOLUS
1000.0000 mL | Freq: Once | INTRAVENOUS | Status: AC
  Administered 2018-07-18: 1000 mL via INTRAVENOUS

## 2018-07-18 MED ORDER — SODIUM CHLORIDE 0.9 % IV SOLN
500.0000 mg | INTRAVENOUS | Status: DC
  Filled 2018-07-18: qty 500

## 2018-07-18 MED ORDER — SODIUM CHLORIDE 0.9 % IV BOLUS: 1000.0000 mL | Freq: Once | INTRAVENOUS | Status: DC

## 2018-07-18 MED ORDER — PREGABALIN 75 MG PO CAPS
75.0000 mg | ORAL_CAPSULE | Freq: Three times a day (TID) | ORAL | Status: DC
  Administered 2018-07-18 – 2018-07-20 (×6): 75 mg via ORAL
  Filled 2018-07-18 (×6): qty 1

## 2018-07-18 MED ORDER — SODIUM CHLORIDE 0.9 % IV SOLN
1.0000 g | Freq: Once | INTRAVENOUS | Status: AC
  Administered 2018-07-18: 1 g via INTRAVENOUS
  Filled 2018-07-18: qty 10

## 2018-07-18 MED ORDER — ENOXAPARIN SODIUM 150 MG/ML ~~LOC~~ SOLN
1.0000 mg/kg | Freq: Once | SUBCUTANEOUS | Status: AC
  Administered 2018-07-18: 140 mg via SUBCUTANEOUS
  Filled 2018-07-18: qty 0.93

## 2018-07-18 MED ORDER — SODIUM CHLORIDE 0.9 % IV BOLUS
500.0000 mL | Freq: Once | INTRAVENOUS | Status: AC
  Administered 2018-07-18: 500 mL via INTRAVENOUS

## 2018-07-18 MED ORDER — DILTIAZEM HCL 25 MG/5ML IV SOLN
20.0000 mg | Freq: Once | INTRAVENOUS | Status: AC
  Administered 2018-07-18: 20 mg via INTRAVENOUS
  Filled 2018-07-18: qty 5

## 2018-07-18 MED ORDER — IPRATROPIUM-ALBUTEROL 0.5-2.5 (3) MG/3ML IN SOLN
3.0000 mL | Freq: Four times a day (QID) | RESPIRATORY_TRACT | Status: DC
  Administered 2018-07-18: 3 mL via RESPIRATORY_TRACT
  Filled 2018-07-18: qty 3

## 2018-07-18 MED ORDER — FUROSEMIDE 10 MG/ML IJ SOLN
40.0000 mg | INTRAMUSCULAR | Status: AC
  Administered 2018-07-18: 40 mg via INTRAVENOUS
  Filled 2018-07-18: qty 4

## 2018-07-18 NOTE — ED Notes (Addendum)
Pt state he must leave at 1400. ndeclines another iv. Dr. Erma Heritage aware and at bedside. Dr. Erma Heritage states leaving AMA may result in death or disablity. Pt voices understanding of same

## 2018-07-18 NOTE — ED Provider Notes (Signed)
[facility] EMERGENCY DEPARTMENT Provider Note   CSN: [id number] Arrival date & time: 07/18/18  1622    History   Chief Complaint Chief Complaint  Patient presents with  . pneumonia/re-turn for admit    HPI Frank May is a 58 y.o. male.     The patient is a ill-appearing 58 year old male, he has a known history of high blood pressure as well as asthma, he has had multiple surgeries to his feet.  He presents within the next 2 hours after leaving AGAINST MEDICAL ADVICE after being told that he likely had multifocal pneumonia new onset atrial fibrillation and congestive heart failure.  He states that he is coming back because he knows that he probably needs an overnight stay.  He still feels palpitations shortness of breath and is coughing.  The cough is dry and nonproductive, there is no fevers, he does have peripheral edema which is chronic left greater than right and attributable to his multiple prior surgeries.  He denies a known history of congestive heart failure or coronary artery disease.     Past Medical History:  Diagnosis Date  . Arthritis    right knee, right hip, right shoulder  . 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 Asthma   . GERD (gastroesophageal reflux disease)   . History of gout    right knee  . Hypertension    states under control with med., has been on med. > 20 yr.  . 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 S/P hardware removal 10/29/2016    Past Surgical History:  Procedure Laterality Date  . CLOSED REDUCTION FOOT DISLOCATION Left   . FOOT ARTHRODESIS Right 10/29/2016   Procedure: Right subtalar arthrodesis;  Surgeon: Toni Arthurs, MD;  Location: [facility];  Service: Orthopedics;  Laterality: Right;  . FOOT HARDWARE REMOVAL Right 2017  . FOOT SURGERY Right    5-6 x  . HARDWARE REMOVAL Right 10/29/2016   Procedure: Right ankle  removal of deep implant;  Surgeon: Toni Arthurs, MD;  Location: [facility];  Service: Orthopedics;  Laterality: Right;  . NOSE SURGERY     x 3  . ORIF FOOT FRACTURE Right 1990        Home Medications    Prior to Admission medications   Medication Sig Start Date End Date Taking? Authorizing Provider  albuterol (PROVENTIL HFA;VENTOLIN HFA) 108 (90 Base) MCG/ACT inhaler Inhale into the lungs every 6 (six) hours as needed for wheezing or shortness of breath.    [provider]  allopurinol (ZYLOPRIM) 100 MG tablet Take 100 mg by mouth daily.    [provider]  amoxicillin-clavulanate (AUGMENTIN) 875-125 MG tablet Take 1 tablet by mouth every 12 (twelve) hours for 10 days. 07/18/18 07/28/18  Shaune Pollack, MD  aspirin EC 81 MG tablet Take 1 tablet (81 mg total) by mouth 2 (two) times daily. 10/29/16   Jacinta Shoe, PA-C  docusate sodium (COLACE) 100 MG capsule Take 1 capsule (100 mg total) by mouth 2 (two) times daily. While taking narcotic pain medicine. Patient not taking: Reported on 07/18/2018 10/29/16   Jacinta Shoe, PA-C  doxycycline (VIBRAMYCIN) 100 MG capsule Take 1 capsule (100 mg total) by mouth 2 (two) times daily for 10 days. 07/18/18 07/28/18  Shaune Pollack, MD  lisinopril (PRINIVIL,ZESTRIL) 10 MG tablet Take 10 mg by mouth daily.  [provider]  oxyCODONE (ROXICODONE) 5 MG immediate release tablet Take 1-2 tablets (5-10 mg total) by mouth every 4 (four) hours as needed for moderate pain or severe pain. For no more than 3 days. 10/29/16   Jacinta Shoellis, Justin Pike, PA-C  pregabalin (LYRICA) 75 MG capsule Take 75 mg by mouth 3 (three) times daily. 07/11/18   [provider]  ranitidine (ZANTAC) 150 MG tablet Take 150 mg by mouth daily.    [provider]  senna (SENOKOT) 8.6 MG TABS tablet Take 2 tablets (17.2 mg total) by mouth 2 (two) times daily. Patient not taking: Reported on 07/18/2018 10/29/16   Jacinta Shoellis, Justin Pike,  PA-C    Family History No family history on file.  Social History Social History   Tobacco Use  . Smoking status: Former Smoker    Last attempt to quit: 10/09/2016    Years since quitting: 1.7  . Smokeless tobacco: Never Used  Substance Use Topics  . Alcohol use: Yes    Comment: occasionally  . Drug use: No     Allergies   Patient has no known allergies.   Review of Systems Review of Systems  All other systems reviewed and are negative.    Physical Exam Updated Vital Signs BP 135/73 (BP Location: Left Arm)   Pulse 73   Temp 99.1 F (37.3 C) (Oral)   Resp (!) 22   SpO2 100%   Physical Exam Vitals signs and nursing note reviewed.  Constitutional:      General: He is in acute distress.     Appearance: He is well-developed.  HENT:     Head: Normocephalic and atraumatic.     Mouth/Throat:     Pharynx: No oropharyngeal exudate.  Eyes:     General: No scleral icterus.       Right eye: No discharge.        Left eye: No discharge.     Conjunctiva/sclera: Conjunctivae normal.     Pupils: Pupils are equal, round, and reactive to light.  Neck:     Musculoskeletal: Normal range of motion and neck supple.     Thyroid: No thyromegaly.     Vascular: No JVD.  Cardiovascular:     Rate and Rhythm: Tachycardia present. Rhythm irregular.     Heart sounds: Normal heart sounds. No murmur. No friction rub. No gallop.   Pulmonary:     Effort: Pulmonary effort is normal. No respiratory distress.     Breath sounds: Rales present. No wheezing.  Abdominal:     General: Bowel sounds are normal. There is no distension.     Palpations: Abdomen is soft. There is no mass.     Tenderness: There is no abdominal tenderness.  Musculoskeletal: Normal range of motion.        General: No tenderness.     Right lower leg: Edema present.     Left lower leg: Edema present.  Lymphadenopathy:     Cervical: No cervical adenopathy.  Skin:    General: Skin is warm and dry.     Findings: No  erythema or rash.  Neurological:     Mental Status: He is alert.     Coordination: Coordination normal.  Psychiatric:        Behavior: Behavior normal.      ED Treatments / Results  Labs (all labs ordered are listed, but only abnormal results are displayed) Labs Reviewed - No data to display  EKG None  Radiology Ct Abdomen Pelvis  Wo Contrast  Result Date: 07/18/2018 CLINICAL DATA:  59 year old male with nausea, vomiting, diarrhea and frequent urination for the past week. Initial encounter. EXAM: CT ABDOMEN AND PELVIS WITHOUT CONTRAST TECHNIQUE: Multidetector CT imaging of the abdomen and pelvis was performed following the standard protocol without IV contrast. COMPARISON:  07/18/2018 chest x-ray. No comparison abdominal/pelvic CT. FINDINGS: Lower chest: Consolidation left lower lobe with multiple air bronchograms. Left lower lobe bronchial thickening. One of left lower lobe peripheral bronchi is occluded. Subcarinal adenopathy. Heart size top-normal. Trace left coronary artery calcification. Trace pericardial fluid. Hepatobiliary: Slightly lobular contour of the anterior margin liver without other findings of cirrhosis. Taking into account limitation by non contrast imaging, no worrisome hepatic lesion. No calcified gallstone or common bile duct stone. No CT evidence of gallbladder inflammation. Pancreas: Taking into account limitation by non contrast imaging, no worrisome pancreatic lesion or inflammation. Spleen: Taking into account limitation by non contrast imaging, no splenic mass or enlargement. Adrenals/Urinary Tract: No obstructing stone or hydronephrosis. Along the inferior aspect left kidney is a 1.2 cm calcification similar to 02/26/2016 plain film of the lumbar spine. 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 Taking into account limitation by non contrast imaging, no worrisome adrenal, right renal or urinary bladder lesion.  Stomach/Bowel: Sigmoid colon and descending colon diverticulosis. No extraluminal bowel inflammatory process noted. No inflammation surrounds the appendix. Portions of the stomach, small bowel and colon under distended slightly limiting evaluation. No obvious mass. Fluid within the rectosigmoid region may be related to patient's diarrhea. Vascular/Lymphatic: Atherosclerotic changes aorta and aortic branch vessels. No abdominal aortic aneurysm. Scattered normal size lymph nodes. Reproductive: Top-normal size prostate gland. Other: No free air or bowel containing hernia. Musculoskeletal: Degenerative changes lumbar spine most notable involving facet joints L4-5 level. Bony prominence iliac crest/right greater trochanter may be related to enthesopathy. IMPRESSION: 1. Consolidation left lower lobe with multiple air bronchograms. Left lower lobe bronchial thickening. One of left lower lobe peripheral bronchi is occluded. Subcarinal adenopathy. Follow-up until clearance recommended to exclude underlying lesion. 2. No obstructing stone or hydronephrosis. 3. Along the inferior aspect left kidney is a 1.2 cm calcification similar to 02/26/2016. This does not have peripheral calcification as is typically seen with a renal artery aneurysm. If further delineation is clinically desired, this could be assessed with dedicated CT angiogram of the abdomen. 4. Sigmoid colon and descending colon diverticulosis. 5. Fluid within the rectosigmoid region may be related to patient's history diarrhea. 6. Aortic Atherosclerosis (ICD10-I70.0). Trace coronary artery calcifications. Electronically Signed   By: Lacy Duverney M.D.   On: 07/18/2018 13:28   Dg Chest 2 View  Result Date: 07/18/2018 CLINICAL DATA:  Vomiting with diarrhea and frequent urination for a week. EXAM: CHEST - 2 VIEW COMPARISON:  None. FINDINGS: The heart size and mediastinal contours are normal. There is mild elevation of the right hemidiaphragm. There are diffuse  bilateral airspace opacities, left greater than right. The pulmonary vasculature is ill-defined. There may be a small amount of pleural fluid on the left. No pneumothorax. No acute osseous findings are evident. IMPRESSION: Diffuse left greater than right pulmonary airspace opacities, likely pulmonary edema. Cannot exclude early left lower lobe pneumonia. Electronically Signed   By: Carey Bullocks M.D.   On: 07/18/2018 12:17    Procedures .Critical Care Performed by: Eber Hong, MD Authorized by: Eber Hong, MD   Critical care provider statement:    Critical care time (minutes):  35  Critical care time was exclusive of:  Separately billable procedures and treating other patients and teaching time   Critical care was necessary to treat or prevent imminent or life-threatening deterioration of the following conditions:  Cardiac failure   Critical care was time spent personally by me on the following activities:  Blood draw for specimens, development of treatment plan with patient or surrogate, discussions with consultants, evaluation of patient's response to treatment, examination of patient, obtaining history from patient or surrogate, ordering and performing treatments and interventions, ordering and review of laboratory studies, ordering and review of radiographic studies, pulse oximetry, re-evaluation of patient's condition and review of old charts   (including critical care time)  Medications Ordered in ED Medications - No data to display   Initial Impression / Assessment and Plan / ED Course  I have reviewed the triage vital signs and the nursing notes.  Pertinent labs & imaging results that were available during my care of the patient were reviewed by me and considered in my medical decision making (see chart for details).        The patient is tachycardic, atrial fibrillation is present, chest x-ray shows multifocal infiltrates left greater than right consistent with either  CHF or pneumonia, at this time the patient will need to be admitted to the hospital, antibiotics, rate control.  Patient agreeabl  The patient is persistently tachycardic, he was given a shot of Lovenox to anticoagulate given the new onset A. fib, he does not know how long that is been going on.  Lasix was given as well as antibiotics, I discussed the care with Dr. Luberta Robertson of the hospitalist service who will admit.  The patient is critically ill with new onset A. fib with rapid ventricular rate and congestive heart failure.  Final Clinical Impressions(s) / ED Diagnoses   Final diagnoses:  Acute congestive heart failure, unspecified heart failure type (HCC)  Atrial fibrillation with rapid ventricular response (HCC)      Eber Hong, MD 07/18/18 1715

## 2018-07-18 NOTE — H&P (Addendum)
History and Physical:    Frank May   WGN:[id number] DOB: Mar 10, 1961 DOA: 07/18/2018  Referring MD/provider: Dr. Hyacinth MeekerMiller PCP: Domenick BookbinderHess, Kristin M, PA-C   Patient coming from: Home  Chief Complaint: nausea vomiting diarrhea for 5 days with fevers and chills.  History of Present Illness:   Frank May is an 58 y.o. male with past medical history significant for asthma and 60-pack-year tobacco history [place] was in his usual state of health until 5 days ago when he developed sudden onset of nausea and vomiting up to 5-6 times a day as well as diarrhea up to 5-6 times a day associated with fevers chills or malaise. Patient thinks he got this virus from the children who live at home with them. Patient notes that his symptoms resolved slowly and were resolved by yesterday however his fevers persisted and were associated with diaphoresis. Patient notes that he has had "total body infections" several times in the past and he was concerned that his body was infected.  Review of systems is positive for worsened DOEx 2 days which he attributes to asthma. He does admit to a cough for the past day which is nonproductive. He admits to some orthopnea for the past day. Denies any PND. Patient denies chest pain/discomfort. Patient denies palpitation or sensation of an irregular heart rate. He does have ongoing diaphoresis and fever.   ED Course:  On initial presentation earlier this morning, patient was noted to be hypoxic and have new onset atrial fibrillation with RVR. Chest x-ray revealed multifocal infiltrates. Patient did not want to stay and signed out AGAINST MEDICAL ADVICE and was discharged on Augmentin and doxycycline. However patient returned 3 hours later stating he felt he probably needed to be in the hospital for at least 1 day. He thinks his breathing is better since he got a DuoNeb.  ROS:   ROS   Review of Systems: Skin: No rashes, lesions,  wounds Eyes: no discharge, redness, pain HENT: no ear pain, hearing loss, drainage, tinnitus Endocrine:  no polyuria Respiratory: Positive cough,, shortness of breath, no hemoptysis Cardiovascular: No palpitations, chest pain GU: No dysuria, increased frequency CNS: No numbness, dizziness, headache Blood/lymphatics: No easy bruising, bleeding  Past Medical History:   Past Medical History:  Diagnosis Date  . Arthritis    right knee, right hip, right shoulder  . 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 Asthma   . GERD (gastroesophageal reflux disease)   . History of gout    right knee  . Hypertension    states under control with med., has been on med. > 20 yr.  . 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 CLOSED REDUCTION FOOT DISLOCATION Left   . FOOT ARTHRODESIS Right 10/29/2016   Procedure: Right subtalar arthrodesis;  Surgeon: Toni ArthursHewitt, John, MD;  Location: [facility];  Service: Orthopedics;  Laterality: Right;  . FOOT HARDWARE REMOVAL Right 2017  . FOOT SURGERY Right    5-6 x  . HARDWARE REMOVAL Right 10/29/2016   Procedure: Right ankle removal of deep implant;  Surgeon: Toni ArthursHewitt, John, MD;  Location: [facility];  Service: Orthopedics;  Laterality: Right;  . NOSE SURGERY     x 3  . ORIF FOOT FRACTURE Right 1990    Social History:   Social History   Socioeconomic History  .  Marital status: Married    Spouse name: Not on file  . Number of children: Not on file  . Years of education: Not on file  . Highest education level: Not on file  Occupational History  . Not on file  Social Needs  . Financial resource strain: Not on file  . Food insecurity:    Worry: Not on file    Inability: Not on file  . Transportation needs:    Medical: Not on file    Non-medical: Not on file  Tobacco Use  . Smoking status: Former Smoker    Last attempt to quit:  10/09/2016    Years since quitting: 1.7  . Smokeless tobacco: Never Used  Substance and Sexual Activity  . Alcohol use: Yes    Comment: occasionally  . Drug use: No  . Sexual activity: Not on file  Lifestyle  . Physical activity:    Days per week: Not on file    Minutes per session: Not on file  . Stress: Not on file  Relationships  . Social connections:    Talks on phone: Not on file    Gets together: Not on file    Attends religious service: Not on file    Active member of club or organization: Not on file    Attends meetings of clubs or organizations: Not on file    Relationship status: Not on file  . Intimate partner violence:    Fear of current or ex partner: Not on file    Emotionally abused: Not on file    Physically abused: Not on file    Forced sexual activity: Not on file  Other Topics Concern  . Not on file  Social History Narrative  . Not on file    Allergies   Patient has no known allergies.  Family history:   No family history on file.  Current Medications:   Prior to Admission medications   Medication Sig Start Date End Date Taking? Authorizing Provider  albuterol (PROVENTIL HFA;VENTOLIN HFA) 108 (90 Base) MCG/ACT inhaler Inhale 2 puffs into the lungs every 6 (six) hours as needed for wheezing or shortness of breath.    Yes [provider]  albuterol (PROVENTIL) (2.5 MG/3ML) 0.083% nebulizer solution Take 2.5 mg by nebulization 3 (three) times daily as needed for wheezing or shortness of breath.   Yes [provider]  allopurinol (ZYLOPRIM) 100 MG tablet Take 100 mg by mouth daily.   Yes [provider]  HYDROcodone-acetaminophen (NORCO) 10-325 MG tablet Take 1 tablet by mouth every 6 (six) hours as needed (for pain).   Yes [provider]  lisinopril (PRINIVIL,ZESTRIL) 10 MG tablet Take 10 mg by mouth daily.    Yes [provider]  pregabalin (LYRICA) 75 MG capsule Take 75 mg by mouth 3 (three) times daily.  07/11/18  Yes [provider]  amoxicillin-clavulanate (AUGMENTIN) 875-125 MG tablet Take 1 tablet by mouth every 12 (twelve) hours for 10 days. 07/18/18 07/28/18  Shaune Pollack, MD  aspirin EC 81 MG tablet Take 1 tablet (81 mg total) by mouth 2 (two) times daily. Patient not taking: Reported on 07/18/2018 10/29/16   Jacinta Shoe, PA-C  docusate sodium (COLACE) 100 MG capsule Take 1 capsule (100 mg total) by mouth 2 (two) times daily. While taking narcotic pain medicine. Patient not taking: Reported on 07/18/2018 10/29/16   Jacinta Shoe, PA-C  doxycycline (VIBRAMYCIN) 100 MG capsule Take 1 capsule (100 mg total) by mouth 2 (  two) times daily for 10 days. 07/18/18 07/28/18  Shaune Pollack, MD  oxyCODONE (ROXICODONE) 5 MG immediate release tablet Take 1-2 tablets (5-10 mg total) by mouth every 4 (four) hours as needed for moderate pain or severe pain. For no more than 3 days. Patient not taking: Reported on 07/18/2018 10/29/16   Jacinta Shoe, PA-C  ranitidine (ZANTAC) 150 MG tablet Take 150 mg by mouth daily.    [provider]  senna (SENOKOT) 8.6 MG TABS tablet Take 2 tablets (17.2 mg total) by mouth 2 (two) times daily. Patient not taking: Reported on 07/18/2018 10/29/16   Jacinta Shoe, [place]    Physical Exam:   Vitals:   07/18/18 1635 07/18/18 1800 07/18/18 1900 07/18/18 1948  BP: 135/73 138/87 103/65   Pulse: 73 65 (!) 39   Resp: (!) 22 (!) 26 (!) 29   Temp: 99.1 F (37.3 C)     TempSrc: Oral     SpO2: 100% 95% 95% 97%     Physical Exam: Blood pressure 103/65, pulse (!) 39, temperature 99.1 F (37.3 C), temperature source Oral, resp. rate (!) 29, SpO2 97 %. Gen: chronically ill-appearing man looking older than stated age sitting up in bed with mild tachypnea which she denies. Eyes: Sclerae anicteric. Conjunctiva mildly injected. Chest: poor air entry bilaterally with no wheezes but he does have coarse rales at the left base and scattered rhonchi in  bilateral lung fields. CV: Tachycardic, irregular. Abdomen: NABS, obese, soft, nondistended, nontender. No tenderness to light or deep palpation. No rebound, no guarding. Extremities: No edema.  Skin: Warm and dry. No rashes, lesions or wounds. Neuro: Alert and oriented times 3; grossly nonfocal. Psych: Patient is cooperative, logical and coherent with appropriate mood and affect.  Data Review:    Labs: Basic Metabolic Panel: Recent Labs  Lab 07/18/18 1047 07/18/18 1811  NA 134*  --   K 3.7  --   CL 102  --   CO2 18*  --   GLUCOSE 109*  --   BUN 16  --   CREATININE 1.49*  --   CALCIUM 8.2*  --   MG  --  2.0   Liver Function Tests: Recent Labs  Lab 07/18/18 1047  AST 72*  ALT 32  ALKPHOS 51  BILITOT 1.6*  PROT 8.3*  ALBUMIN 3.0*   Recent Labs  Lab 07/18/18 1047  LIPASE 15   No results for input(s): AMMONIA in the last 168 hours. CBC: Recent Labs  Lab 07/18/18 1047  WBC 12.6*  NEUTROABS 10.4*  HGB 12.4*  HCT 37.3*  MCV 71.3*  PLT 133*   Cardiac Enzymes: Recent Labs  Lab 07/18/18 1047 07/18/18 1811  TROPONINI 0.05* 0.03*    BNP (last 3 results) No results for input(s): PROBNP in the last 8760 hours. CBG: No results for input(s): GLUCAP in the last 168 hours.  Urinalysis No results found for: COLORURINE, APPEARANCEUR, LABSPEC, PHURINE, GLUCOSEU, HGBUR, BILIRUBINUR, KETONESUR, PROTEINUR, UROBILINOGEN, NITRITE, LEUKOCYTESUR    Radiographic Studies: Ct Abdomen Pelvis Wo Contrast  Result Date: 07/18/2018 CLINICAL DATA:  58 year old male with nausea, vomiting, diarrhea and frequent urination for the past week. Initial encounter. EXAM: CT ABDOMEN AND PELVIS WITHOUT CONTRAST TECHNIQUE: Multidetector CT imaging of the abdomen and pelvis was performed following the standard protocol without IV contrast. COMPARISON:  07/18/2018 chest x-ray. No comparison abdominal/pelvic CT. FINDINGS: Lower chest: Consolidation left lower lobe with multiple air  bronchograms. Left lower lobe bronchial thickening. One of left lower lobe  peripheral bronchi is occluded. Subcarinal adenopathy. Heart size top-normal. Trace left coronary artery calcification. Trace pericardial fluid. Hepatobiliary: Slightly lobular contour of the anterior margin liver without other findings of cirrhosis. Taking into account limitation by non contrast imaging, no worrisome hepatic lesion. No calcified gallstone or common bile duct stone. No CT evidence of gallbladder inflammation. Pancreas: Taking into account limitation by non contrast imaging, no worrisome pancreatic lesion or inflammation. Spleen: Taking into account limitation by non contrast imaging, no splenic mass or enlargement. Adrenals/Urinary Tract: No obstructing stone or hydronephrosis. Along the inferior aspect left kidney is a 1.2 cm calcification similar to 02/26/2016 plain film of the lumbar spine. 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 Taking into account limitation by non contrast imaging, no worrisome adrenal, right renal or urinary bladder lesion. Stomach/Bowel: Sigmoid colon and descending colon diverticulosis. No extraluminal bowel inflammatory process noted. No inflammation surrounds the appendix. Portions of the stomach, small bowel and colon under distended slightly limiting evaluation. No obvious mass. Fluid within the rectosigmoid region may be related to patient's diarrhea. Vascular/Lymphatic: Atherosclerotic changes aorta and aortic branch vessels. No abdominal aortic aneurysm. Scattered normal size lymph nodes. Reproductive: Top-normal size prostate gland. Other: No free air or bowel containing hernia. Musculoskeletal: Degenerative changes lumbar spine most notable involving facet joints L4-5 level. Bony prominence iliac crest/right greater trochanter may be related to enthesopathy. IMPRESSION: 1. Consolidation left lower lobe with multiple air bronchograms. Left  lower lobe bronchial thickening. One of left lower lobe peripheral bronchi is occluded. Subcarinal adenopathy. Follow-up until clearance recommended to exclude underlying lesion. 2. No obstructing stone or hydronephrosis. 3. Along the inferior aspect left kidney is a 1.2 cm calcification similar to 02/26/2016. This does not have peripheral calcification as is typically seen with a renal artery aneurysm. If further delineation is clinically desired, this could be assessed with dedicated CT angiogram of the abdomen. 4. Sigmoid colon and descending colon diverticulosis. 5. Fluid within the rectosigmoid region may be related to patient's history diarrhea. 6. Aortic Atherosclerosis (ICD10-I70.0). Trace coronary artery calcifications. Electronically Signed   By: Lacy DuverneySteven  Olson M.D.   On: 07/18/2018 13:28   Dg Chest 2 View  Result Date: 07/18/2018 CLINICAL DATA:  Vomiting with diarrhea and frequent urination for a week. EXAM: CHEST - 2 VIEW COMPARISON:  None. FINDINGS: The heart size and mediastinal contours are normal. There is mild elevation of the right hemidiaphragm. There are diffuse bilateral airspace opacities, left greater than right. The pulmonary vasculature is ill-defined. There may be a small amount of pleural fluid on the left. No pneumothorax. No acute osseous findings are evident. IMPRESSION: Diffuse left greater than right pulmonary airspace opacities, likely pulmonary edema. Cannot exclude early left lower lobe pneumonia. Electronically Signed   By: Carey BullocksWilliam  Veazey M.D.   On: 07/18/2018 12:17    EKG: Independently reviewed.  EKG done at 10 this morning: Atrial fibrillation at 115. Normal axis. No acute ST-T wave changes. She'll PVC. EKG done at 4:30 today after he returned: Atrial fibrillation at 130. Multiple PVCs. No acute ST-T wave changes.   Assessment/Plan:   Principal Problem:   Acute on chronic respiratory failure with hypoxia (HCC) Active Problems:   CAP (community acquired  pneumonia)   COPD (chronic obstructive pulmonary disease) (HCC)   Acute CHF (congestive heart failure) (HCC)   Essential hypertension  NEW HYPOXEMIA Patient with history of asthma/COPD comes in with new hypoxemia and infiltrates on chest x-ray. At this point is  unclear whether the infiltrates represent pneumonia versus pulmonary edema versus both. Patient has had fever for the past week associated with nausea vomiting and diarrhea. He also has new atrial fibrillation with elevated BNP. Patient states he's never been told he's had pulmonary edema in the past but has had "5 cases of walking pneumonia".  CAP I suspect patient has an infection likely bacterial Will continue ceftriaxone and azithromycin for likely community-acquired pneumonia.  CONGESTIVE HEART FAILURE  Suspect patient may also have some level of pulmonary edema given long history of hypertension with new onset A. Fib with RVR. He may well have some level of  HF PEF. Patient received Lasix 40 mg IV 1 in ED, urine output measurement is pending At this point I am not continuing any diuretics as I am not sure how much of his infiltration is secondary to pulmonary edema vs infection.  Could consider repeating chest x-ray in the morning to see if there is marked improvement in infiltrates after the Lasix. Echocardiogram ordered  NEW ATRIAL FIBRILLATION  Rate control with Cardizem and patient placed on Cardizem drip Cardizem can be switched to beta blocker once we have a better understanding of his cardiac function after echocardiogram Eliquis started in ED, will continue but CHADS Vasc2 score is 1 so this can possibly be discontinued.    COPD We will treat with DuoNeb's every 6 hours No wheezing on exam, will not start steroids at this point however would have low threshold for starting this should he develop wheezing.  HTN Will hold lisinopril given initiation of diltiazem Patient will likely need to be started on a beta  blocker and lisinopril restarted once we have a better understanding of cardiac function.  RENAL DYSFUNCTION Baseline renal function is unknown in this patient. We'll need to follow closely as we diurese him.  ONGOING TOBACCO USE Patient declines nicotine patch. Notes "don't tell me to quit smoking".  CHRONIC PAIN Continue home doses of Norco 10/325 and Lyrica  GOUT Continue allopurinol    Other information:   DVT prophylaxis: Lovenox ordered. Code Status: Full code. Family Communication: patient states his wife knows he is in the ED.  Disposition Plan: home Consults called: none Admission status: inpatient   The medical decision making on this patient was of high complexity and the patient is at high risk for clinical deterioration, therefore this is a level 3 visit.  Horatio Pel Tublu Inanna Telford [facility]  If 7PM-7AM, please contact night-coverage [email or web address] Password [facility] 07/18/2018, 7:57 PM

## 2018-07-18 NOTE — ED Triage Notes (Addendum)
Pt reports vomiting/diarrhea and frequent urination for a week. Pt very diaphoretic in triage. Pt sent here by PCP.

## 2018-07-18 NOTE — Discharge Instructions (Signed)
As we discussed, I am worried that you are septic from a severe infection, and this is also causing stress on your heart.  By leaving now, you are putting yourself at significant risk of dying.  This could happen without any kind of worsening or preceding symptoms.  It is critical that you return to the emergency department as soon as you are able.  AGAIN, I AM HIGHLY WORRIED ABOUT YOUR CONDITION. LEAVING NOW COULD CAUSE YOU SEVERE PAIN, DISABILITY, AND DEATH. IF YOU CHANGE YOUR MIND OR ARE ABLE TO ARRANGE CARE FOR YOUR GRANDSON, PLEASE COME BACK TO THE ER IMMEDIATELY

## 2018-07-18 NOTE — Progress Notes (Signed)
Pharmacy Antibiotic Note  Frank May is a 58 y.o. male admitted on 07/18/2018 with sepsis.  Pharmacy has been consulted for vancomycin and cefepime dosing.  SCr 1.49  Plan: Start cefepime 2g IV Q2h Give vancomycin 2,500mg  IV x 1, then start vancomycin 1750mg  IV Q24 Monitor clinical picture, renal function, vanc levels prn F/U C&S, abx deescalation / LOT  Height: 5\' 9"  (175.3 cm) Weight: (!) 307 lb (139.3 kg) IBW/kg (Calculated) : 70.7  Temp (24hrs), Avg:98.4 F (36.9 C), Min:98.4 F (36.9 C), Max:98.4 F (36.9 C)  Recent Labs  Lab 07/18/18 1047 07/18/18 1150  WBC 12.6*  --   CREATININE 1.49*  --   LATICACIDVEN  --  1.4    Estimated Creatinine Clearance: 74.1 mL/min (A) (by C-G formula based on SCr of 1.49 mg/dL (H)).    No Known Allergies   Thank you for allowing pharmacy to be a part of this patient's care.  Armandina Stammer 07/18/2018 1:25 PM

## 2018-07-18 NOTE — ED Notes (Signed)
Pt back from CT

## 2018-07-18 NOTE — ED Triage Notes (Signed)
Patient was here earlier today and left AMA due to having to pick up grandson from school. Needs to be admitted for atrial fib and pneumonia. Alert and oriented and now agrees to the admission. Alert and oriented, NAD

## 2018-07-18 NOTE — ED Notes (Signed)
Dr. Erma Heritage aware of trop

## 2018-07-18 NOTE — ED Notes (Addendum)
Pt ambulates to discharge with steady gait after instructions discussed and pt urged to return for further treatment. Pt declines wc and is discharged with friend at side. States he understands instructions

## 2018-07-18 NOTE — ED Provider Notes (Addendum)
[facility] EMERGENCY DEPARTMENT Provider Note   CSN: [id number] Arrival date & time: 07/18/18  5625     History   Chief Complaint Chief Complaint  Patient presents with  . GI Problem    HPI JOBY WINT is a 58 y.o. male.  HPI 58 year old male with past medical history of hypertension here with nausea, vomiting, and diarrhea.  Patient states that for the last week, he has had a mild cough, nausea, vomiting, and watery bowel movements.  He said associated generalized fatigue, subjective fevers, and weakness.  He has had some shortness of breath over the last several days as well.  He has been producing yellow-green sputum.  He went to see his doctor and was sent here for further evaluation.  Denies specific sick contacts.  His shortness of breath seems to worsen with exertion.  Denies any overt persistent abdominal pain, but does have some intermittent cramping when he has vomiting or diarrhea.  He has had no blood in his vomit or diarrhea.  No recent medication changes.  No other medical complaints.  No chest pain currently.   Past Medical History:  Diagnosis Date  . Arthritis    right knee, right hip, right shoulder  . 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 Asthma   . GERD (gastroesophageal reflux disease)   . History of gout    right knee  . Hypertension    states under control with med., has been on med. > 20 yr.  . 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 S/P hardware removal 10/29/2016    Past Surgical History:  Procedure Laterality Date  . CLOSED REDUCTION FOOT DISLOCATION Left   . FOOT ARTHRODESIS Right 10/29/2016   Procedure: Right subtalar arthrodesis;  Surgeon: Toni Arthurs, MD;  Location: [facility];  Service: Orthopedics;  Laterality: Right;  . FOOT HARDWARE REMOVAL Right 2017  . FOOT SURGERY Right    5-6 x  . HARDWARE REMOVAL Right 10/29/2016   Procedure: Right ankle removal of deep implant;  Surgeon: Toni Arthurs, MD;  Location: [facility];  Service: Orthopedics;  Laterality: Right;  . NOSE SURGERY     x 3  . ORIF FOOT FRACTURE Right 1990        Home Medications    Prior to Admission medications   Medication Sig Start Date End Date Taking? Authorizing Provider  albuterol (PROVENTIL HFA;VENTOLIN HFA) 108 (90 Base) MCG/ACT inhaler Inhale into the lungs every 6 (six) hours as needed for wheezing or shortness of breath.   Yes [provider]  aspirin EC 81 MG tablet Take 1 tablet (81 mg total) by mouth 2 (two) times daily. 10/29/16  Yes Jacinta Shoe, PA-C  lisinopril (PRINIVIL,ZESTRIL) 10 MG tablet Take 10 mg by mouth daily.    Yes [provider]  oxyCODONE (ROXICODONE) 5 MG immediate release tablet Take 1-2 tablets (5-10 mg total) by mouth every 4 (four) hours as needed for moderate pain or severe pain. For no more than 3 days. 10/29/16  Yes Jacinta Shoe, PA-C  pregabalin (LYRICA) 75 MG capsule Take 75 mg by mouth 3 (three) times daily. 07/11/18  Yes [provider]  ranitidine (ZANTAC) 150 MG tablet Take 150 mg by mouth daily.   Yes [provider]  allopurinol (ZYLOPRIM) 100 MG tablet Take 100 mg by mouth daily.    [provider]  amoxicillin-clavulanate (AUGMENTIN) 875-125 MG tablet Take 1 tablet by mouth every 12 (twelve) hours for 10 days. 07/18/18 07/28/18  Shaune Pollack, MD  docusate sodium (COLACE) 100 MG capsule Take 1 capsule (100 mg total) by mouth 2 (two) times daily. While taking narcotic pain medicine. Patient not taking: Reported on 07/18/2018 10/29/16   Jacinta Shoe, PA-C  doxycycline (VIBRAMYCIN) 100 MG capsule Take 1 capsule (100 mg total) by mouth 2 (two) times daily for 10 days. 07/18/18 07/28/18  Shaune Pollack, MD  senna (SENOKOT) 8.6 MG TABS tablet Take 2 tablets (17.2 mg total) by mouth 2 (two) times daily. Patient not taking: Reported  on 07/18/2018 10/29/16   Jacinta Shoe, PA-C    Family History History reviewed. No pertinent family history.  Social History Social History   Tobacco Use  . Smoking status: Former Smoker    Last attempt to quit: 10/09/2016    Years since quitting: 1.7  . Smokeless tobacco: Never Used  Substance Use Topics  . Alcohol use: Yes    Comment: occasionally  . Drug use: No     Allergies   Patient has no known allergies.   Review of Systems Review of Systems  Constitutional: Positive for fatigue. Negative for chills and fever.  HENT: Negative for congestion and rhinorrhea.   Eyes: Negative for visual disturbance.  Respiratory: Positive for cough and shortness of breath. Negative for wheezing.   Cardiovascular: Negative for chest pain and leg swelling.  Gastrointestinal: Positive for abdominal pain, diarrhea, nausea and vomiting.  Genitourinary: Negative for dysuria and flank pain.  Musculoskeletal: Negative for neck pain and neck stiffness.  Skin: Negative for rash and wound.  Allergic/Immunologic: Negative for immunocompromised state.  Neurological: Positive for weakness. Negative for syncope and headaches.  All other systems reviewed and are negative.    Physical Exam Updated Vital Signs BP 95/80   Pulse (!) 50   Temp 98.4 F (36.9 C) (Oral)   Resp 17   Ht 5\' 9"  (1.753 m)   Wt (!) 139.3 kg   SpO2 98%   BMI 45.34 kg/m   Physical Exam Vitals signs and nursing note reviewed.  Constitutional:      General: He is not in acute distress.    Appearance: He is well-developed.  HENT:     Head: Normocephalic and atraumatic.     Comments: Dry MM Eyes:     Conjunctiva/sclera: Conjunctivae normal.  Neck:     Musculoskeletal: Neck supple.  Cardiovascular:     Rate and Rhythm: Normal rate. Rhythm irregularly irregular.     Heart sounds: Normal heart sounds. No murmur. No friction rub.  Pulmonary:     Effort: Pulmonary effort is normal. Tachypnea present. No  respiratory distress.     Breath sounds: Examination of the right-lower field reveals rales. Examination of the left-lower field reveals rales. Decreased breath sounds and rales present. No wheezing.  Abdominal:     General: There is no distension.     Palpations: Abdomen is soft.     Tenderness: There is no abdominal tenderness.  Skin:    General: Skin is warm.     Capillary Refill: Capillary refill takes less than 2 seconds.  Neurological:     Mental Status: He is alert and oriented to person, place, and time.     Motor: No abnormal muscle tone.      ED Treatments / Results  Labs (all labs ordered are listed, but only abnormal results are displayed) Labs  Reviewed  CBC WITH DIFFERENTIAL/PLATELET - Abnormal; Notable for the following components:      Result Value   WBC 12.6 (*)    Hemoglobin 12.4 (*)    HCT 37.3 (*)    MCV 71.3 (*)    MCH 23.7 (*)    Platelets 133 (*)    Neutro Abs 10.4 (*)    All other components within normal limits  COMPREHENSIVE METABOLIC PANEL - Abnormal; Notable for the following components:   Sodium 134 (*)    CO2 18 (*)    Glucose, Bld 109 (*)    Creatinine, Ser 1.49 (*)    Calcium 8.2 (*)    Total Protein 8.3 (*)    Albumin 3.0 (*)    AST 72 (*)    Total Bilirubin 1.6 (*)    GFR calc non Af Amer 51 (*)    GFR calc Af Amer 59 (*)    All other components within normal limits  TROPONIN I - Abnormal; Notable for the following components:   Troponin I 0.05 (*)    All other components within normal limits  BRAIN NATRIURETIC PEPTIDE - Abnormal; Notable for the following components:   B Natriuretic Peptide 1,433.6 (*)    All other components within normal limits  CULTURE, BLOOD (ROUTINE X 2)  CULTURE, BLOOD (ROUTINE X 2)  URINE CULTURE  LIPASE, BLOOD  LACTIC ACID, PLASMA  LACTIC ACID, PLASMA  URINALYSIS, ROUTINE W REFLEX MICROSCOPIC    EKG EKG Interpretation  Date/Time:  Monday July 18 2018 10:52:26 EST Ventricular Rate:  114 PR  Interval:    QRS Duration: 92 QT Interval:  358 QTC Calculation: 493 R Axis:   12 Text Interpretation:  Atrial fibrillation Ventricular premature complex Borderline prolonged QT interval Baseline wander in lead(s) V2 No old tracing to compare Confirmed by Shaune Pollack [phone]) on 07/18/2018 12:39:33 PM   Radiology Ct Abdomen Pelvis Wo Contrast  Result Date: 07/18/2018 CLINICAL DATA:  58 year old male with nausea, vomiting, diarrhea and frequent urination for the past week. Initial encounter. EXAM: CT ABDOMEN AND PELVIS WITHOUT CONTRAST TECHNIQUE: Multidetector CT imaging of the abdomen and pelvis was performed following the standard protocol without IV contrast. COMPARISON:  07/18/2018 chest x-ray. No comparison abdominal/pelvic CT. FINDINGS: Lower chest: Consolidation left lower lobe with multiple air bronchograms. Left lower lobe bronchial thickening. One of left lower lobe peripheral bronchi is occluded. Subcarinal adenopathy. Heart size top-normal. Trace left coronary artery calcification. Trace pericardial fluid. Hepatobiliary: Slightly lobular contour of the anterior margin liver without other findings of cirrhosis. Taking into account limitation by non contrast imaging, no worrisome hepatic lesion. No calcified gallstone or common bile duct stone. No CT evidence of gallbladder inflammation. Pancreas: Taking into account limitation by non contrast imaging, no worrisome pancreatic lesion or inflammation. Spleen: Taking into account limitation by non contrast imaging, no splenic mass or enlargement. Adrenals/Urinary Tract: No obstructing stone or hydronephrosis. Along the inferior aspect left kidney is a 1.2 cm calcification similar to 02/26/2016 plain film of the lumbar spine. 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 Taking into account limitation by non contrast imaging, no worrisome adrenal, right renal or urinary bladder lesion. Stomach/Bowel:  Sigmoid colon and descending colon diverticulosis. No extraluminal bowel inflammatory process noted. No inflammation surrounds the appendix. Portions of the stomach, small bowel and colon under distended slightly limiting evaluation. No obvious mass. Fluid within the rectosigmoid region may be related to patient's diarrhea. Vascular/Lymphatic: Atherosclerotic changes aorta  and aortic branch vessels. No abdominal aortic aneurysm. Scattered normal size lymph nodes. Reproductive: Top-normal size prostate gland. Other: No free air or bowel containing hernia. Musculoskeletal: Degenerative changes lumbar spine most notable involving facet joints L4-5 level. Bony prominence iliac crest/right greater trochanter may be related to enthesopathy. IMPRESSION: 1. Consolidation left lower lobe with multiple air bronchograms. Left lower lobe bronchial thickening. One of left lower lobe peripheral bronchi is occluded. Subcarinal adenopathy. Follow-up until clearance recommended to exclude underlying lesion. 2. No obstructing stone or hydronephrosis. 3. Along the inferior aspect left kidney is a 1.2 cm calcification similar to 02/26/2016. This does not have peripheral calcification as is typically seen with a renal artery aneurysm. If further delineation is clinically desired, this could be assessed with dedicated CT angiogram of the abdomen. 4. Sigmoid colon and descending colon diverticulosis. 5. Fluid within the rectosigmoid region may be related to patient's history diarrhea. 6. Aortic Atherosclerosis (ICD10-I70.0). Trace coronary artery calcifications. Electronically Signed   By: Lacy Duverney M.D.   On: 07/18/2018 13:28   Dg Chest 2 View  Result Date: 07/18/2018 CLINICAL DATA:  Vomiting with diarrhea and frequent urination for a week. EXAM: CHEST - 2 VIEW COMPARISON:  None. FINDINGS: The heart size and mediastinal contours are normal. There is mild elevation of the right hemidiaphragm. There are diffuse bilateral airspace  opacities, left greater than right. The pulmonary vasculature is ill-defined. There may be a small amount of pleural fluid on the left. No pneumothorax. No acute osseous findings are evident. IMPRESSION: Diffuse left greater than right pulmonary airspace opacities, likely pulmonary edema. Cannot exclude early left lower lobe pneumonia. Electronically Signed   By: Carey Bullocks M.D.   On: 07/18/2018 12:17    Procedures .Critical Care Performed by: Shaune Pollack, MD Authorized by: Shaune Pollack, MD   Critical care provider statement:    Critical care time (minutes):  35   Critical care time was exclusive of:  Separately billable procedures and treating other patients and teaching time   Critical care was necessary to treat or prevent imminent or life-threatening deterioration of the following conditions:  Cardiac failure, circulatory failure, respiratory failure and sepsis   Critical care was time spent personally by me on the following activities:  Development of treatment plan with patient or surrogate, discussions with consultants, evaluation of patient's response to treatment, examination of patient, obtaining history from patient or surrogate, ordering and performing treatments and interventions, ordering and review of laboratory studies, ordering and review of radiographic studies, pulse oximetry, re-evaluation of patient's condition and review of old charts   I assumed direction of critical care for this patient from another provider in my specialty: no     (including critical care time)  Medications Ordered in ED Medications  ceFEPIme (MAXIPIME) 2 g in sodium chloride 0.9 % 100 mL IVPB (2 g Intravenous New Bag/Given 07/18/18 1324)  metroNIDAZOLE (FLAGYL) IVPB 500 mg (has no administration in time range)  vancomycin (VANCOCIN) 2,500 mg in sodium chloride 0.9 % 500 mL IVPB (2,500 mg Intravenous New Bag/Given 07/18/18 1343)  sodium chloride 0.9 % bolus 500 mL (500 mLs Intravenous New  Bag/Given 07/18/18 1336)  sodium chloride 0.9 % bolus 1,000 mL (0 mLs Intravenous Stopped 07/18/18 1150)     Initial Impression / Assessment and Plan / ED Course  I have reviewed the triage vital signs and the nursing notes.  Pertinent labs & imaging results that were available during my care of the patient were reviewed by me and  considered in my medical decision making (see chart for details).     58 year old male here with shortness of breath, nausea, vomiting, and loose stools.  On arrival, patient mildly tachypneic.  He appears to be in new onset atrial fibrillation.  Labs are consistent with suspected dehydration versus hypoperfusion.  Lactic acid is normal.  Chest x-ray shows multifocal pneumonia.  Given his nausea, vomiting, and diarrhea, CT scan obtained as well and is pending.  CT scan confirms PNA. No surgical abnormality. Renal artery aneurysm noted.  After receiving fluids, patient states he feels much better.  I recommended admission but patient is adamant that he must leave to pick up his grandson.  I had a very long discussion with the patient as well as his family, both independently and with both in the room.  I discussed my concern for sepsis and new onset arrhythmia, with very high risk for clinical decompensation.  I discussed that without appropriate treatment, I am very concerned he will develop sepsis, heart failure, heart attack, or other likely lethal complication.  At a long discussion with him that many of these would not provide any kind of warning, and that he could progress or die without any kind of worsening or preceding symptoms.  I discussed that he is at risk of this both resting, and particularly with driving, and advised that he could not drive to pick up his grandson.  He states that he will take the bus.  I again reiterated to himself and his family about his grave condition and need for admission.  I discussed that he could suffer severe pain, disability, and  very likely die without medical management.  He states he will try to return once he picks up his grandson.  Again, the risks and benefits of leaving AMA were discussed in detail.  He appears awake, alert, and is able to voice to me understanding of both his arrhythmia, as well as his pneumonia versus heart failure.  He states that he is aware of these conditions, and that he understands what leaving right now could entail, including disability and death.  At this time, he is, however, amenable to receiving a dose of antibiotics and treatment until 2 PM.  Will plan to give him additional antibiotics as a second best treatment option, but reiterated that he should return immediately after arranging for care for his grandson.  PATIENT AMBULATED OUT OF ED, SIGNED OUT AMA. I was able to convince him to at least receive an additional bolus of fluid, IV Vanc, and IV Cefepime prior to his AMA.  Final Clinical Impressions(s) / ED Diagnoses   Final diagnoses:  Multifocal pneumonia  Sepsis due to pneumonia [facility](HCC)  Left against medical advice  New onset atrial fibrillation [facility](HCC)    ED Discharge Orders         Ordered    amoxicillin-clavulanate (AUGMENTIN) 875-125 MG tablet  Every 12 hours     07/18/18 1343    doxycycline (VIBRAMYCIN) 100 MG capsule  2 times daily     07/18/18 1343           Shaune PollackIsaacs, Rondo Spittler, MD 07/18/18 1344    Shaune PollackIsaacs, Jaymeson Mengel, MD 08/12/18 [phone]

## 2018-07-18 NOTE — ED Notes (Signed)
Pt states he will let me start some of vancomycin but remains adamant that he will leave to pick up grandson at 1400. Pt cautioned that he could have event at any time with no warning and that includes while walking out. 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 Pt urged to return or call 911 for any new symptoms.

## 2018-07-18 NOTE — ED Notes (Signed)
Patient not in room for blood draw. 

## 2018-07-19 ENCOUNTER — Other Ambulatory Visit (HOSPITAL_COMMUNITY): Payer: Medicare Other

## 2018-07-19 LAB — BASIC METABOLIC PANEL
Anion gap: 8 (ref 5–15)
BUN: 16 mg/dL (ref 6–20)
CO2: 21 mmol/L — ABNORMAL LOW (ref 22–32)
CREATININE: 1 mg/dL (ref 0.61–1.24)
Calcium: 7.4 mg/dL — ABNORMAL LOW (ref 8.9–10.3)
Chloride: 104 mmol/L (ref 98–111)
GFR calc Af Amer: >60 mL/min (ref >60)
GFR calc non Af Amer: >60 mL/min (ref >60)
Glucose, Bld: 95 mg/dL (ref 70–99)
Potassium: 3.3 mmol/L — ABNORMAL LOW (ref 3.5–5.1)
Sodium: 133 mmol/L — ABNORMAL LOW (ref 135–145)

## 2018-07-19 LAB — CBC
HEMATOCRIT: 33.5 % — AB (ref 39.0–52.0)
Hemoglobin: 11 g/dL — ABNORMAL LOW (ref 13.0–17.0)
MCH: 22.9 pg — ABNORMAL LOW (ref 26.0–34.0)
MCHC: 32.8 g/dL (ref 30.0–36.0)
MCV: 69.8 fL — ABNORMAL LOW (ref 80.0–100.0)
Platelets: 127 10*3/uL — ABNORMAL LOW (ref 150–400)
RBC: 4.8 MIL/uL (ref 4.22–5.81)
RDW: 14.8 % (ref 11.5–15.5)
WBC: 6 10*3/uL (ref 4.0–10.5)
nRBC: 0 % (ref 0.0–0.2)

## 2018-07-19 LAB — LIPID PANEL
Cholesterol: 163 mg/dL (ref 0–200)
HDL: 10 mg/dL — ABNORMAL LOW (ref >40)
LDL Cholesterol: 115 mg/dL — ABNORMAL HIGH (ref 0–99)
Total CHOL/HDL Ratio: 16.3 RATIO
Triglycerides: 190 mg/dL — ABNORMAL HIGH (ref <150)
VLDL: 38 mg/dL (ref 0–40)

## 2018-07-19 LAB — TROPONIN I
TROPONIN I: 0.03 ng/mL — AB (ref <0.03)
Troponin I: 0.03 ng/mL (ref <0.03)

## 2018-07-19 LAB — HIV ANTIBODY (ROUTINE TESTING W REFLEX): HIV Screen 4th Generation wRfx: NONREACTIVE

## 2018-07-19 MED ORDER — SENNOSIDES-DOCUSATE SODIUM 8.6-50 MG PO TABS
1.0000 | ORAL_TABLET | Freq: Two times a day (BID) | ORAL | Status: DC
  Administered 2018-07-19 – 2018-07-20 (×2): 1 via ORAL
  Filled 2018-07-19 (×2): qty 1

## 2018-07-19 MED ORDER — POTASSIUM CHLORIDE CRYS ER 20 MEQ PO TBCR
40.0000 meq | EXTENDED_RELEASE_TABLET | Freq: Once | ORAL | Status: AC
  Administered 2018-07-19: 40 meq via ORAL
  Filled 2018-07-19: qty 2

## 2018-07-19 MED ORDER — HYDROCODONE-ACETAMINOPHEN 10-325 MG PO TABS
1.0000 | ORAL_TABLET | Freq: Four times a day (QID) | ORAL | Status: DC | PRN
  Administered 2018-07-19 – 2018-07-20 (×2): 1 via ORAL
  Filled 2018-07-19 (×2): qty 1

## 2018-07-19 MED ORDER — LEVALBUTEROL HCL 0.63 MG/3ML IN NEBU: 0.6300 mg | INHALATION_SOLUTION | RESPIRATORY_TRACT | Status: DC | PRN

## 2018-07-19 MED ORDER — METOPROLOL TARTRATE 5 MG/5ML IV SOLN
10.0000 mg | Freq: Once | INTRAVENOUS | Status: AC
  Administered 2018-07-19: 10 mg via INTRAVENOUS
  Filled 2018-07-19: qty 10

## 2018-07-19 MED ORDER — METOPROLOL TARTRATE 5 MG/5ML IV SOLN
5.0000 mg | Freq: Four times a day (QID) | INTRAVENOUS | Status: DC
  Administered 2018-07-20 (×2): 5 mg via INTRAVENOUS
  Filled 2018-07-19 (×4): qty 5

## 2018-07-19 NOTE — Progress Notes (Signed)
Administered 10 mg of lopressor at 1704. 5mg  of Lopressor was held at 1750 due to BP 91/70. HR 67. Sharon Seller, MD notified and will continue to monitor.

## 2018-07-19 NOTE — Plan of Care (Signed)
  Problem: Education: Goal: Knowledge of General Education information will improve Description Including pain rating scale, medication(s)/side effects and non-pharmacologic comfort measures Outcome: Progressing   Problem: Health Behavior/Discharge Planning: Goal: Ability to manage health-related needs will improve Outcome: Progressing   

## 2018-07-19 NOTE — Progress Notes (Signed)
Attempted echo-HR above 150

## 2018-07-19 NOTE — Progress Notes (Addendum)
Frank May TEAM 1 - Stepdown/ICU TEAM  Frank May  BMW:[id number] DOB: 1961/04/21 DOA: 07/18/2018 PCP: Domenick Bookbinder, PA-C    Brief Narrative:  58 y.o. w/ a hx of "ashtma" and longstanding tobacco abuse who presented w/ nausea, vomiting up to 5-6 times a day, as well as diarrhea up to 5-6 times a day associated with fevers chills and malaise. These sx were followed by severe DOE, the reason he presented to the ED.   In the ED he was noted to be hypoxic and in atrial fibrillation with RVR. CXR noted multifocal infiltrates. He initially signed out AMA, only to return 3 hours later for admit.  Significant Events: 2/17 admit  Subjective: The patient is aggravated that he is still in the hospital.  He tells me that he is leaving tomorrow "no matter what."  He denies chest pain shortness of breath fevers chills nausea or vomiting.  He tells me he feels "completely normal and fine."  As I am talking to him his heart rate is 125 bpm, as he is laying quietly in bed.  I have explained to the patient the implications of atrial fibrillation to include pulmonary edema/heart failure due to tachycardia as well as stroke.  I have explained to him my desire to keep him in the hospital until his atrial fibrillation is rate controlled and we can ensure that full anticoagulation is arranged in the outpatient setting.  I have also explained to him that we need to evaluate him for LV systolic dysfunction or possible valvular abnormalities.  Assessment & Plan:  Acute hypoxic respiratory failure  Rapidly resolved with patient 98% on room air at this time  ?CAP v/s pulmonary edema  Flu negative - on CT abdom one of LLL peripheral bronchi is occluded - also noted is subcarinal adenopathy - follow-up until clearance is recommended to exclude underlying lesion -clinically I am not convinced this represents an infectious pneumonia as his white count is normal and he is not febrile and his hypoxia has very  rapidly improved -there may be an element of aspiration pneumonitis but I suspect this is primarily pulmonary edema related to tachycardia versus previously unappreciated congestive heart failure -TTE pending but has been delayed due to persisting tachycardia  Newly appreciated Atrial fibrillation with uncontrolled RVR Mg is 2.0 - TSH pending - TTE pending but has been delayed due to persisting tachycardia -initiate scheduled IV beta-blocker -if this does not control the patient's heart rate we will have to consider resuming Cardizem or using IV amiodarone -notes did not make it clear if the patient was treated for prolonged period with IV Cardizem nor did they indicate why it may have been stopped  Hypokalemia  Supplement to goal of 4.0  Acute kidney injury crt 1.5 at presentation - improved since admit - f/u in AM  COPD - active smoker No acute wheezing at this time - counsel on need to quit smoking  HTN BP controlled   Gout  Denies acute flair at this time   HLD LDL 115 - does not appear to be on medical tx - needs diet education and outpt f/u  Microcytic anemia  Worrisome - check anemia panel - check stool hemoccult - this could complicate anticoag   Noncompliant  Treatment of this patient will prove quite difficult due to his persistent desire to leave the hospital -we will continue to encourage him to stay and warned him of the grave consequences of leaving before his medical work-up and  treatment are completed  DVT prophylaxis: eliquis  Code Status: FULL CODE Family Communication: no family present at time of exam  Disposition Plan: SDU  Consultants:  none  Antimicrobials:  Azithro 2/17  Ceftriaxone 2/07   Objective: Blood pressure (!) 123/93, pulse 88, temperature 98.7 F (37.1 C), temperature source Oral, resp. rate (!) 29, height 5\' 9"  (1.753 m), weight (!) 138.9 kg, SpO2 98 %.  Intake/Output Summary (Last 24 hours) at 07/19/2018 1617 Last data filed at  07/19/2018 0900 Gross per 24 hour  Intake 340 ml  Output 800 ml  Net -460 ml   Filed Weights   07/18/18 2002 07/19/18 0514  Weight: (!) 138.9 kg (!) 138.9 kg    Examination: General: No acute respiratory distress at rest  Lungs: mild bibasilar crackles - no wheezing  Cardiovascular: tachycardic and irreg irreg - no M or rub  Abdomen: Nontender, nondistended, soft, bowel sounds positive, no rebound, no ascites, no appreciable mass - overweight  Extremities: 1+ B LE edema   CBC: Recent Labs  Lab 07/18/18 1047 07/19/18 0621  WBC 12.6* 6.0  NEUTROABS 10.4*  --   HGB 12.4* 11.0*  HCT 37.3* 33.5*  MCV 71.3* 69.8*  PLT 133* 127*   Basic Metabolic Panel: Recent Labs  Lab 07/18/18 1047 07/18/18 1811 07/19/18 0621  NA 134*  --  133*  K 3.7  --  3.3*  CL 102  --  104  CO2 18*  --  21*  GLUCOSE 109*  --  95  BUN 16  --  16  CREATININE 1.49*  --  1.00  CALCIUM 8.2*  --  7.4*  MG  --  2.0  --    GFR: Estimated Creatinine Clearance: 110.3 mL/min (by C-G formula based on SCr of 1 mg/dL).  Liver Function Tests: Recent Labs  Lab 07/18/18 1047  AST 72*  ALT 32  ALKPHOS 51  BILITOT 1.6*  PROT 8.3*  ALBUMIN 3.0*   Recent Labs  Lab 07/18/18 1047  LIPASE 15    Cardiac Enzymes: Recent Labs  Lab 07/18/18 1047 07/18/18 1811 07/19/18 0032 07/19/18 0621  TROPONINI 0.05* 0.03* 0.03* 0.03*    Recent Results (from the past 240 hour(s))  Blood Culture (routine x 2)     Status: None (Preliminary result)   Collection Time: 07/18/18  1:38 PM  Result Value Ref Range Status   Specimen Description BLOOD RIGHT FOREARM  Final   Special Requests   Final    BOTTLES DRAWN AEROBIC AND ANAEROBIC Blood Culture results may not be optimal due to an inadequate volume of blood received in culture bottles   Culture   Final    NO GROWTH < 24 HOURS Performed at [facility], [address], [place], [place] [address]    Report Status PENDING  Incomplete  Blood Culture (routine  x 2)     Status: None (Preliminary result)   Collection Time: 07/18/18  1:39 PM  Result Value Ref Range Status   Specimen Description BLOOD LEFT FOREARM  Final   Special Requests   Final    BOTTLES DRAWN AEROBIC AND ANAEROBIC Blood Culture adequate volume   Culture   Final    NO GROWTH < 24 HOURS Performed at [facility], [address] [place], [place] [address]    Report Status PENDING  Incomplete     Scheduled Meds: . allopurinol  100 mg Oral Daily  . apixaban  5 mg Oral BID  . aspirin  EC  81 mg Oral Daily  . diltiazem  20 mg Intravenous Once  . ipratropium-albuterol  3 mL Nebulization TID  . pregabalin  75 mg Oral TID   Continuous Infusions: . azithromycin    . cefTRIAXone (ROCEPHIN)  IV       LOS: 1 day   Lonia BloodJeffrey T. Volney Reierson, MD [facility] Office  [phone] Pager - Text Page per Amion  If 7PM-7AM, please contact night-coverage per Amion 07/19/2018, 4:17 PM

## 2018-07-20 ENCOUNTER — Inpatient Hospital Stay (HOSPITAL_COMMUNITY): Payer: Medicare Other

## 2018-07-20 DIAGNOSIS — I4891 Unspecified atrial fibrillation: Secondary | ICD-10-CM

## 2018-07-20 LAB — RETICULOCYTES
Immature Retic Fract: 19.9 % — ABNORMAL HIGH (ref 2.3–15.9)
RBC.: 4.87 MIL/uL (ref 4.22–5.81)
RETIC CT PCT: 1.1 % (ref 0.4–3.1)
Retic Count, Absolute: 53.1 10*3/uL (ref 19.0–186.0)

## 2018-07-20 LAB — COMPREHENSIVE METABOLIC PANEL
ALT: 36 U/L (ref 0–44)
AST: 48 U/L — ABNORMAL HIGH (ref 15–41)
Albumin: 2.4 g/dL — ABNORMAL LOW (ref 3.5–5.0)
Alkaline Phosphatase: 43 U/L (ref 38–126)
Anion gap: 9 (ref 5–15)
BUN: 16 mg/dL (ref 6–20)
CO2: 22 mmol/L (ref 22–32)
Calcium: 8 mg/dL — ABNORMAL LOW (ref 8.9–10.3)
Chloride: 108 mmol/L (ref 98–111)
Creatinine, Ser: 0.93 mg/dL (ref 0.61–1.24)
GFR calc Af Amer: >60 mL/min (ref >60)
GFR calc non Af Amer: >60 mL/min (ref >60)
Glucose, Bld: 98 mg/dL (ref 70–99)
POTASSIUM: 3.7 mmol/L (ref 3.5–5.1)
Sodium: 139 mmol/L (ref 135–145)
Total Bilirubin: 0.5 mg/dL (ref 0.3–1.2)
Total Protein: 7.5 g/dL (ref 6.5–8.1)

## 2018-07-20 LAB — CBC
HCT: 33.9 % — ABNORMAL LOW (ref 39.0–52.0)
Hemoglobin: 11.3 g/dL — ABNORMAL LOW (ref 13.0–17.0)
MCH: 23.2 pg — ABNORMAL LOW (ref 26.0–34.0)
MCHC: 33.3 g/dL (ref 30.0–36.0)
MCV: 69.6 fL — ABNORMAL LOW (ref 80.0–100.0)
Platelets: 151 10*3/uL (ref 150–400)
RBC: 4.87 MIL/uL (ref 4.22–5.81)
RDW: 15 % (ref 11.5–15.5)
WBC: 4.5 10*3/uL (ref 4.0–10.5)
nRBC: 0 % (ref 0.0–0.2)

## 2018-07-20 LAB — IRON AND TIBC
Iron: 74 ug/dL (ref 45–182)
Saturation Ratios: 48 % — ABNORMAL HIGH (ref 17.9–39.5)
TIBC: 153 ug/dL — ABNORMAL LOW (ref 250–450)
UIBC: 79 ug/dL

## 2018-07-20 LAB — FOLATE: Folate: 14.4 ng/mL (ref >5.9)

## 2018-07-20 LAB — TSH: TSH: 2.563 u[IU]/mL (ref 0.350–4.500)

## 2018-07-20 LAB — ECHOCARDIOGRAM COMPLETE
Height: 69 in
Weight: 4876.8 oz

## 2018-07-20 LAB — FERRITIN: Ferritin: 1529 ng/mL — ABNORMAL HIGH (ref 24–336)

## 2018-07-20 LAB — MAGNESIUM: Magnesium: 2.2 mg/dL (ref 1.7–2.4)

## 2018-07-20 LAB — T4, FREE: Free T4: 1.2 ng/dL (ref 0.82–1.77)

## 2018-07-20 LAB — VITAMIN B12: Vitamin B-12: 512 pg/mL (ref 180–914)

## 2018-07-20 LAB — PROCALCITONIN: Procalcitonin: 5.03 ng/mL

## 2018-07-20 MED ORDER — APIXABAN 5 MG PO TABS: 5.0000 mg | ORAL_TABLET | Freq: Two times a day (BID) | ORAL | 0 refills | Status: DC

## 2018-07-20 MED ORDER — PERFLUTREN LIPID MICROSPHERE
1.0000 mL | INTRAVENOUS | Status: AC | PRN
  Administered 2018-07-20: 2 mL via INTRAVENOUS
  Filled 2018-07-20: qty 10

## 2018-07-20 MED ORDER — SODIUM CHLORIDE 0.9 % IV SOLN
2.0000 g | INTRAVENOUS | Status: DC
  Administered 2018-07-20: 2 g via INTRAVENOUS
  Filled 2018-07-20: qty 20

## 2018-07-20 MED ORDER — SODIUM CHLORIDE 0.9 % IV SOLN
500.0000 mg | INTRAVENOUS | Status: DC
  Administered 2018-07-20: 500 mg via INTRAVENOUS
  Filled 2018-07-20: qty 500

## 2018-07-20 NOTE — Progress Notes (Signed)
  Echocardiogram 2D Echocardiogram has been performed.  Frank May Frank May 07/20/2018, 2:28 PM

## 2018-07-20 NOTE — Discharge Summary (Addendum)
Physician Discharge Summary  FRANCISCOJAVIER CASAUS ZTI:[id number] DOB: [birth year] DOA: 07/18/2018  PCP: Domenick Bookbinder, PA-C  Admit date: 07/18/2018 Discharge date: 07/20/2018  Recommendations for Outpatient Follow-up:  1. PCP in 7-10 days.   Discharge Diagnoses: Principal diagnosis is #1 1. Acute hypoxic respiratory failure: resolved. 2. Paroxysmal Atrial fibrillation with RVR: Rate is controlled. Patietn is discharged on apixaban and metoprolol 3. Hypokalemia: Resolved. 4. COPD: Continue beta agonists as necessary at home 5. AKI: Resolved. 6. Noncompliance: pt is demanding discharge to home before work up is complete. 7. HLD: As per PCP  Discharge Condition: Fair Disposition: Home  Diet recommendation: Heart healthy  Filed Weights   07/18/18 2002 07/19/18 0514 07/20/18 0643  Weight: (!) 138.9 kg (!) 138.9 kg (!) 138.3 kg    History of present illness:  58 y.o.w/ a hx of "ashtma" and longstanding tobacco abuse who presented w/ nausea, vomiting up to 5-6 times a day, as well as diarrhea up to 5-6 times a day associated with fevers chills and malaise. These sx were followed by severe DOE, the reason he presented to the ED.   In the ED he was noted to be hypoxic and in atrial fibrillation with RVR. CXR noted multifocal infiltrates. He initially signed out AMA, only to return3 hours later for admit.  Hospital Course:  The patient returned to the ED after leaving AMA. The patient was admitted to a telemetry bed. His rate was brought under control with a cardizem drip. He was converted to metoprolol and started on apixaban. Today the patient's heart rate is under good control and he is saturating in the high nineties on room air.  Today's assessment: S: Awake, alert, and orietned x 3. No acute distress. Wants to leave. O: Vitals:  Vitals:   07/20/18 1030 07/20/18 1343  BP: 107/73 108/73  Pulse: 75 81  Resp:  11  Temp:  98.3 F (36.8 C)  SpO2:      Constitutional:  . The  patient is awake, alert, and oriented x 3. No acute distress. Respiratory:  . No wheezes, rales, or rhonchi. . No tactile fremitus . No increased work of breathing. Cardiovascular:  . RRR, no m/r/g . No LE extremity edema   . Normal pedal pulses Abdomen:  . Abdomen soft, non-tender, non-distended . No hernias, masses, or organomegaly . Normoactive bowel sounds. Musculoskeletal:  . No cyanosis, clubbing or edema Skin:  . No rashes, lesions, ulcers . palpation of skin: no induration or nodules Neurologic:  . CN 2-12 intact . Sensation all 4 extremities intact Psychiatric:  . judgement and insight appear normal . Mental status o Mood, affect appropriate o Orientation to person, place, time     Discharge Instructions  Discharge Instructions    Activity as tolerated - No restrictions   Complete by:  As directed    Call MD for:   Complete by:  As directed    Appointment with PCP in 7-10 days.   Call MD for:  difficulty breathing, headache or visual disturbances   Complete by:  As directed    Diet - low sodium heart healthy   Complete by:  As directed    Increase activity slowly   Complete by:  As directed      Allergies as of 07/20/2018   No Known Allergies     Medication List    STOP taking these medications   oxyCODONE 5 MG immediate release tablet Commonly known as:  ROXICODONE     TAKE  these medications   albuterol 108 (90 Base) MCG/ACT inhaler Commonly known as:  PROVENTIL HFA;VENTOLIN HFA Inhale 2 puffs into the lungs every 6 (six) hours as needed for wheezing or shortness of breath.   albuterol (2.5 MG/3ML) 0.083% nebulizer solution Commonly known as:  PROVENTIL Take 2.5 mg by nebulization 3 (three) times daily as needed for wheezing or shortness of breath.   allopurinol 100 MG tablet Commonly known as:  ZYLOPRIM Take 100 mg by mouth daily.   amoxicillin-clavulanate 875-125 MG tablet Commonly known as:  AUGMENTIN Take 1 tablet by mouth every 12  (twelve) hours for 10 days.   apixaban 5 MG Tabs tablet Commonly known as:  ELIQUIS Take 1 tablet (5 mg total) by mouth 2 (two) times daily.   aspirin EC 81 MG tablet Take 1 tablet (81 mg total) by mouth 2 (two) times daily.   docusate sodium 100 MG capsule Commonly known as:  COLACE Take 1 capsule (100 mg total) by mouth 2 (two) times daily. While taking narcotic pain medicine.   doxycycline 100 MG capsule Commonly known as:  VIBRAMYCIN Take 1 capsule (100 mg total) by mouth 2 (two) times daily for 10 days.   HYDROcodone-acetaminophen 10-325 MG tablet Commonly known as:  NORCO Take 1 tablet by mouth every 6 (six) hours as needed (for pain).   lisinopril 10 MG tablet Commonly known as:  PRINIVIL,ZESTRIL Take 10 mg by mouth daily.   pregabalin 75 MG capsule Commonly known as:  LYRICA Take 75 mg by mouth 3 (three) times daily.   ranitidine 150 MG tablet Commonly known as:  ZANTAC Take 150 mg by mouth daily.   senna 8.6 MG Tabs tablet Commonly known as:  SENOKOT Take 2 tablets (17.2 mg total) by mouth 2 (two) times daily.     Metoprolol 25 mg 1 PO bid No Known Allergies  The results of significant diagnostics from this hospitalization (including imaging, microbiology, ancillary and laboratory) are listed below for reference.    Significant Diagnostic Studies: Ct Abdomen Pelvis Wo Contrast  Result Date: 07/18/2018 CLINICAL DATA:  58 year old male with nausea, vomiting, diarrhea and frequent urination for the past week. Initial encounter. EXAM: CT ABDOMEN AND PELVIS WITHOUT CONTRAST TECHNIQUE: Multidetector CT imaging of the abdomen and pelvis was performed following the standard protocol without IV contrast. COMPARISON:  07/18/2018 chest x-ray. No comparison abdominal/pelvic CT. FINDINGS: Lower chest: Consolidation left lower lobe with multiple air bronchograms. Left lower lobe bronchial thickening. One of left lower lobe peripheral bronchi is occluded. Subcarinal  adenopathy. Heart size top-normal. Trace left coronary artery calcification. Trace pericardial fluid. Hepatobiliary: Slightly lobular contour of the anterior margin liver without other findings of cirrhosis. Taking into account limitation by non contrast imaging, no worrisome hepatic lesion. No calcified gallstone or common bile duct stone. No CT evidence of gallbladder inflammation. Pancreas: Taking into account limitation by non contrast imaging, no worrisome pancreatic lesion or inflammation. Spleen: Taking into account limitation by non contrast imaging, no splenic mass or enlargement. Adrenals/Urinary Tract: No obstructing stone or hydronephrosis. Along the inferior aspect left kidney is a 1.2 cm calcification similar to 02/26/2016 plain film of the lumbar spine. 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 Taking into account limitation by non contrast imaging, no worrisome adrenal, right renal or urinary bladder lesion. Stomach/Bowel: Sigmoid colon and descending colon diverticulosis. No extraluminal bowel inflammatory process noted. No inflammation surrounds the appendix. Portions of the stomach, small bowel and colon under distended  slightly limiting evaluation. No obvious mass. Fluid within the rectosigmoid region may be related to patient's diarrhea. Vascular/Lymphatic: Atherosclerotic changes aorta and aortic branch vessels. No abdominal aortic aneurysm. Scattered normal size lymph nodes. Reproductive: Top-normal size prostate gland. Other: No free air or bowel containing hernia. Musculoskeletal: Degenerative changes lumbar spine most notable involving facet joints L4-5 level. Bony prominence iliac crest/right greater trochanter may be related to enthesopathy. IMPRESSION: 1. Consolidation left lower lobe with multiple air bronchograms. Left lower lobe bronchial thickening. One of left lower lobe peripheral bronchi is occluded. Subcarinal adenopathy.  Follow-up until clearance recommended to exclude underlying lesion. 2. No obstructing stone or hydronephrosis. 3. Along the inferior aspect left kidney is a 1.2 cm calcification similar to 02/26/2016. This does not have peripheral calcification as is typically seen with a renal artery aneurysm. If further delineation is clinically desired, this could be assessed with dedicated CT angiogram of the abdomen. 4. Sigmoid colon and descending colon diverticulosis. 5. Fluid within the rectosigmoid region may be related to patient's history diarrhea. 6. Aortic Atherosclerosis (ICD10-I70.0). Trace coronary artery calcifications. Electronically Signed   By: Lacy Duverney M.D.   On: 07/18/2018 13:28   Dg Chest 2 View  Result Date: 07/18/2018 CLINICAL DATA:  Vomiting with diarrhea and frequent urination for a week. EXAM: CHEST - 2 VIEW COMPARISON:  None. FINDINGS: The heart size and mediastinal contours are normal. There is mild elevation of the right hemidiaphragm. There are diffuse bilateral airspace opacities, left greater than right. The pulmonary vasculature is ill-defined. There may be a small amount of pleural fluid on the left. No pneumothorax. No acute osseous findings are evident. IMPRESSION: Diffuse left greater than right pulmonary airspace opacities, likely pulmonary edema. Cannot exclude early left lower lobe pneumonia. Electronically Signed   By: Carey Bullocks M.D.   On: 07/18/2018 12:17    Microbiology: Recent Results (from the past 240 hour(s))  Blood Culture (routine x 2)     Status: None (Preliminary result)   Collection Time: 07/18/18  1:38 PM  Result Value Ref Range Status   Specimen Description BLOOD RIGHT FOREARM  Final   Special Requests   Final    BOTTLES DRAWN AEROBIC AND ANAEROBIC Blood Culture results may not be optimal due to an inadequate volume of blood received in culture bottles   Culture   Final    NO GROWTH 2 DAYS Performed at [facility], [address]  [place], [place] [address]    Report Status PENDING  Incomplete  Blood Culture (routine x 2)     Status: None (Preliminary result)   Collection Time: 07/18/18  1:39 PM  Result Value Ref Range Status   Specimen Description BLOOD LEFT FOREARM  Final   Special Requests   Final    BOTTLES DRAWN AEROBIC AND ANAEROBIC Blood Culture adequate volume   Culture   Final    NO GROWTH 2 DAYS Performed at [facility], [address], [place], [place] [address]    Report Status PENDING  Incomplete     Labs: Basic Metabolic Panel: Recent Labs  Lab 07/18/18 1047 07/18/18 1811 07/19/18 0621 07/20/18 0336  NA 134*  --  133* 139  K 3.7  --  3.3* 3.7  CL 102  --  104 108  CO2 18*  --  21* 22  GLUCOSE 109*  --  95 98  BUN 16  --  16 16  CREATININE 1.49*  --  1.00 0.93  CALCIUM 8.2*  --  7.4* 8.0*  MG  --  2.0  --  2.2   Liver Function Tests: Recent Labs  Lab 07/18/18 1047 07/20/18 0336  AST 72* 48*  ALT 32 36  ALKPHOS 51 43  BILITOT 1.6* 0.5  PROT 8.3* 7.5  ALBUMIN 3.0* 2.4*   Recent Labs  Lab 07/18/18 1047  LIPASE 15   No results for input(s): AMMONIA in the last 168 hours. CBC: Recent Labs  Lab 07/18/18 1047 07/19/18 0621 07/20/18 0336  WBC 12.6* 6.0 4.5  NEUTROABS 10.4*  --   --   HGB 12.4* 11.0* 11.3*  HCT 37.3* 33.5* 33.9*  MCV 71.3* 69.8* 69.6*  PLT 133* 127* 151   Cardiac Enzymes: Recent Labs  Lab 07/18/18 1047 07/18/18 1811 07/19/18 0032 07/19/18 0621  TROPONINI 0.05* 0.03* 0.03* 0.03*   BNP: BNP (last 3 results) Recent Labs    07/18/18 1047  BNP 1,433.6*    ProBNP (last 3 results) No results for input(s): PROBNP in the last 8760 hours.  CBG: No results for input(s): GLUCAP in the last 168 hours.  Principal Problem:   Acute on chronic respiratory failure with hypoxia (HCC) Active Problems:   CAP (community acquired pneumonia)   COPD (chronic obstructive pulmonary disease) (HCC)   Acute CHF (congestive heart failure) (HCC)   Essential  hypertension   Time coordinating discharge: 38 minutes.  Signed:        Maxine Huynh, DO [facility]  07/20/2018, 6:33 PM   ADDENDUM: Please note: Rx for metoprolol 25 mg bid was missed on discharge. I have contacted the patient's pharmacy and given them a phone prescription for metoprolol tartrate 25 mg #60 with no refills. I have contacted the patient by phone and advised him of the new prescrition waiting for him at the pharmacy, what it is for and why it is important.

## 2018-07-20 NOTE — Discharge Instructions (Signed)
Atrial Fibrillation ° °Atrial fibrillation is a type of heartbeat that is irregular or fast (rapid). If you have this condition, your heart beats without any order. This makes it hard for your heart to pump blood in a normal way. Having this condition gives you more risk for stroke, heart failure, and other heart problems. °Atrial fibrillation may start all of a sudden and then stop on its own, or it may become a long-lasting problem. °What are the causes? °This condition may be caused by heart conditions, such as: °· High blood pressure. °· Heart failure. °· Heart valve disease. °· Heart surgery. °Other causes include: °· Pneumonia. °· Obstructive sleep apnea. °· Lung cancer. °· Thyroid disease. °· Drinking too much alcohol. °Sometimes the cause is not known. °What increases the risk? °You are more likely to develop this condition if: °· You smoke. °· You are older. °· You have diabetes. °· You are overweight. °· You have a family history of this condition. °· You exercise often and hard. °What are the signs or symptoms? °Common symptoms of this condition include: °· A feeling like your heart is beating very fast. °· Chest pain. °· Feeling short of breath. °· Feeling light-headed or weak. °· Getting tired easily. °Follow these instructions at home: °Medicines °· Take over-the-counter and prescription medicines only as told by your doctor. °· If your doctor gives you a blood-thinning medicine, take it exactly as told. Taking too much of it can cause bleeding. Taking too little of it does not protect you against clots. Clots can cause a stroke. °Lifestyle ° °  ° °· Do not use any tobacco products. These include cigarettes, chewing tobacco, and e-cigarettes. If you need help quitting, ask your doctor. °· Do not drink alcohol. °· Do not drink beverages that have caffeine. These include coffee, soda, and tea. °· Follow diet instructions as told by your doctor. °· Exercise regularly as told by your doctor. °General  instructions °· If you have a condition that causes breathing to stop for a short period of time (apnea), treat it as told by your doctor. °· Keep a healthy weight. Do not use diet pills unless your doctor says they are safe for you. Diet pills may make heart problems worse. °· Keep all follow-up visits as told by your doctor. This is important. °Contact a doctor if: °· You notice a change in the speed, rhythm, or strength of your heartbeat. °· You are taking a blood-thinning medicine and you see more bruising. °· You get tired more easily when you move or exercise. °· You have a sudden change in weight. °Get help right away if: ° °· You have pain in your chest or your belly (abdomen). °· You have trouble breathing. °· You have blood in your vomit, poop, or pee (urine). °· You have any signs of a stroke. "BE FAST" is an easy way to remember the main warning signs: °? B - Balance. Signs are dizziness, sudden trouble walking, or loss of balance. °? E - Eyes. Signs are trouble seeing or a change in how you see. °? F - Face. Signs are sudden weakness or loss of feeling in the face, or the face or eyelid drooping on one side. °? A - Arms. Signs are weakness or loss of feeling in an arm. This happens suddenly and usually on one side of the body. °? S - Speech. Signs are sudden trouble speaking, slurred speech, or trouble understanding what people say. °? T - Time.   Time to call emergency services. Write down what time symptoms started. °· You have other signs of a stroke, such as: °? A sudden, very bad headache with no known cause. °? Feeling sick to your stomach (nausea). °? Throwing up (vomiting). °? Jerky movements you cannot control (seizure). °These symptoms may be an emergency. Do not wait to see if the symptoms will go away. Get medical help right away. Call your local emergency services (911 in the [place] Do not drive yourself to the hospital. °Summary °· Atrial fibrillation is a type of heartbeat that is irregular  or fast (rapid). °· You are at higher risk of this condition if you smoke, are older, have diabetes, or are overweight. °· Follow your doctor's instructions about medicines, diet, exercise, and follow-up visits. °· Get help right away if you think that you have signs of a stroke. °This information is not intended to replace advice given to you by your health care provider. Make sure you discuss any questions you have with your health care provider. °Document Released: 02/25/2008 Document Revised: 07/09/2017 Document Reviewed: 07/09/2017 °Elsevier Interactive Patient Education © 2019 [employer] ° ° °Heart-Healthy Eating Plan °Heart-healthy meal planning includes: °· Eating less unhealthy fats. °· Eating more healthy fats. °· Making other changes in your diet. °Talk with your doctor or a diet specialist (dietitian) to create an eating plan that is right for you. °What is my plan? °Your doctor may recommend an eating plan that includes: °· Total fat: ______% or less of total calories a day. °· Saturated fat: ______% or less of total calories a day. °· Cholesterol: less than _________mg a day. °What are tips for following this plan? °Cooking °Avoid frying your food. Try to bake, boil, grill, or broil it instead. You can also reduce fat by: °· Removing the skin from poultry. °· Removing all visible fats from meats. °· Steaming vegetables in water or broth. °Meal planning ° °· At meals, divide your plate into four equal parts: °? Fill one-half of your plate with vegetables and green salads. °? Fill one-fourth of your plate with whole grains. °? Fill one-fourth of your plate with lean protein foods. °· Eat 4-5 servings of vegetables per day. A serving of vegetables is: °? 1 cup of raw or cooked vegetables. °? 2 cups of raw leafy greens. °· Eat 4-5 servings of fruit per day. A serving of fruit is: °? 1 medium whole fruit. °? ¼ cup of dried fruit. °? ½ cup of fresh, frozen, or canned fruit. °? ½ cup of 100% fruit  juice. °· Eat more foods that have soluble fiber. These are apples, broccoli, carrots, beans, peas, and barley. Try to get 20-30 g of fiber per day. °· Eat 4-5 servings of nuts, legumes, and seeds per week: °? 1 serving of dried beans or legumes equals ½ cup after being cooked. °? 1 serving of nuts is ¼ cup. °? 1 serving of seeds equals 1 tablespoon. °General information °· Eat more home-cooked food. Eat less restaurant, buffet, and fast food. °· Limit or avoid alcohol. °· Limit foods that are high in starch and sugar. °· Avoid fried foods. °· Lose weight if you are overweight. °· Keep track of how much salt (sodium) you eat. This is important if you have high blood pressure. Ask your doctor to tell you more about this. °· Try to add vegetarian meals each week. °Fats °· Choose healthy fats. These include olive oil and canola oil, flaxseeds, walnuts, almonds, and   seeds. °· Eat more omega-3 fats. These include salmon, mackerel, sardines, tuna, flaxseed oil, and ground flaxseeds. Try to eat fish at least 2 times each week. °· Check food labels. Avoid foods with trans fats or high amounts of saturated fat. °· Limit saturated fats. °? These are often found in animal products, such as meats, butter, and cream. °? These are also found in plant foods, such as palm oil, palm kernel oil, and coconut oil. °· Avoid foods with partially hydrogenated oils in them. These have trans fats. Examples are stick margarine, some tub margarines, cookies, crackers, and other baked goods. °What foods can I eat? °Fruits °All fresh, canned (in natural juice), or frozen fruits. °Vegetables °Fresh or frozen vegetables (raw, steamed, roasted, or grilled). Green salads. °Grains °Most grains. Choose whole wheat and whole grains most of the time. Rice and pasta, including brown rice and pastas made with whole wheat. °Meats and other proteins °Lean, well-trimmed beef, veal, pork, and lamb. Chicken and turkey without skin. All fish and shellfish.  Wild duck, rabbit, pheasant, and venison. Egg whites or low-cholesterol egg substitutes. Dried beans, peas, lentils, and tofu. Seeds and most nuts. °Dairy °Low-fat or nonfat cheeses, including ricotta and mozzarella. Skim or 1% milk that is liquid, powdered, or evaporated. Buttermilk that is made with low-fat milk. Nonfat or low-fat yogurt. °Fats and oils °Non-hydrogenated (trans-free) margarines. Vegetable oils, including soybean, sesame, sunflower, olive, peanut, safflower, corn, canola, and cottonseed. Salad dressings or mayonnaise made with a vegetable oil. °Beverages °Mineral water. Coffee and tea. Diet carbonated beverages. °Sweets and desserts °Sherbet, gelatin, and fruit ice. Small amounts of dark chocolate. °Limit all sweets and desserts. °Seasonings and condiments °All seasonings and condiments. °The items listed above may not be a complete list of foods and drinks you can eat. Contact a dietitian for more options. °What foods should I avoid? °Fruits °Canned fruit in heavy syrup. Fruit in cream or butter sauce. Fried fruit. Limit coconut. °Vegetables °Vegetables cooked in cheese, cream, or butter sauce. Fried vegetables. °Grains °Breads that are made with saturated or trans fats, oils, or whole milk. Croissants. Sweet rolls. Donuts. High-fat crackers, such as cheese crackers. °Meats and other proteins °Fatty meats, such as hot dogs, ribs, sausage, bacon, rib-eye roast or steak. High-fat deli meats, such as salami and bologna. Caviar. Domestic duck and goose. Organ meats, such as liver. °Dairy °Cream, sour cream, cream cheese, and creamed cottage cheese. Whole-milk cheeses. Whole or 2% milk that is liquid, evaporated, or condensed. Whole buttermilk. Cream sauce or high-fat cheese sauce. Yogurt that is made from whole milk. °Fats and oils °Meat fat, or shortening. Cocoa butter, hydrogenated oils, palm oil, coconut oil, palm kernel oil. Solid fats and shortenings, including bacon fat, salt pork, lard, and  butter. Nondairy cream substitutes. Salad dressings with cheese or sour cream. °Beverages °Regular sodas and juice drinks with added sugar. °Sweets and desserts °Frosting. Pudding. Cookies. Cakes. Pies. Milk chocolate or white chocolate. Buttered syrups. Full-fat ice cream or ice cream drinks. °The items listed above may not be a complete list of foods and drinks to avoid. Contact a dietitian for more information. °Summary °· Heart-healthy meal planning includes eating less unhealthy fats, eating more healthy fats, and making other changes in your diet. °· Eat a balanced diet. This includes fruits and vegetables, low-fat or nonfat dairy, lean protein, nuts and legumes, whole grains, and heart-healthy oils and fats. °This information is not intended to replace advice given to you by your health care provider. Make   sure you discuss any questions you have with your health care provider. °Document Released: 11/17/2011 Document Revised: 06/25/2017 Document Reviewed: 06/25/2017 °Elsevier Interactive Patient Education © 2019 [employer] Inc. ° ° °

## 2018-07-23 LAB — CULTURE, BLOOD (ROUTINE X 2)
Culture: NO GROWTH
Culture: NO GROWTH
Special Requests: ADEQUATE

## 2018-08-01 ENCOUNTER — Telehealth: Payer: Self-pay

## 2018-08-01 NOTE — Telephone Encounter (Signed)
NOTES ON FILE 

## 2018-08-10 ENCOUNTER — Telehealth: Payer: Self-pay | Admitting: *Deleted

## 2018-08-10 ENCOUNTER — Ambulatory Visit (INDEPENDENT_AMBULATORY_CARE_PROVIDER_SITE_OTHER): Payer: Medicare Other | Admitting: Cardiovascular Disease

## 2018-08-10 ENCOUNTER — Other Ambulatory Visit: Payer: Self-pay

## 2018-08-10 ENCOUNTER — Encounter: Payer: Self-pay | Admitting: Cardiovascular Disease

## 2018-08-10 VITALS — BP 136/80 | HR 51 | Ht 69.0 in | Wt 309.8 lb

## 2018-08-10 DIAGNOSIS — I10 Essential (primary) hypertension: Secondary | ICD-10-CM

## 2018-08-10 DIAGNOSIS — I480 Paroxysmal atrial fibrillation: Secondary | ICD-10-CM | POA: Diagnosis not present

## 2018-08-10 DIAGNOSIS — Z720 Tobacco use: Secondary | ICD-10-CM | POA: Diagnosis not present

## 2018-08-10 MED ORDER — METOPROLOL TARTRATE 25 MG PO TABS: 25.0000 mg | ORAL_TABLET | Freq: Two times a day (BID) | ORAL | 11 refills | Status: AC

## 2018-08-10 NOTE — Progress Notes (Signed)
Chief Complaint  Patient presents with  . New Patient (Initial Visit)    History of Present Illness: 58 yo male with history of tobacco abuse, atrial fibrillation, asthma, CHF, GERD, COPD and HTN here today as a new consult, referred by Annye English, PA-C, for the evaluation of atrial fibrillation. He was admitted to [facility] from February 17-19, 2020 with pneumonia and atrial fibrillation. He was discharged on a beta blocker and Eliquis. His dyspnea is better since discharge. He has had no palpitations. No chest pain. Chronic ankle edema that he states has been present since his ankle surgeries.   Primary Care Physician: Gavin Potters   Past Medical History:  Diagnosis Date  . A-fib (HCC)   . Arthritis    right knee, right hip, right shoulder  . 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 Asthma   . GERD (gastroesophageal reflux disease)   . History of COPD   . History of gout    right knee  . Hypertension    states under control with med., has been on med. > 20 yr.  . 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 CLOSED REDUCTION FOOT DISLOCATION Left   . FOOT ARTHRODESIS Right 10/29/2016   Procedure: Right subtalar arthrodesis;  Surgeon: Toni Arthurs, MD;  Location: [facility];  Service: Orthopedics;  Laterality: Right;  . FOOT HARDWARE REMOVAL Right 2017  . FOOT SURGERY Right    5-6 x  . HARDWARE REMOVAL Right 10/29/2016   Procedure: Right ankle removal of deep implant;  Surgeon: Toni Arthurs, MD;  Location: [facility];  Service: Orthopedics;  Laterality: Right;  . NOSE SURGERY     x 3  . ORIF FOOT FRACTURE Right 1990    Current Outpatient Medications  Medication Sig Dispense Refill  . albuterol (PROVENTIL HFA;VENTOLIN HFA) 108 (90 Base) MCG/ACT inhaler Inhale 2 puffs into the lungs every 6 (six) hours as needed for wheezing or shortness of breath.     Marland Kitchen albuterol  (PROVENTIL) (2.5 MG/3ML) 0.083% nebulizer solution Take 2.5 mg by nebulization 3 (three) times daily as needed for wheezing or shortness of breath.    . allopurinol (ZYLOPRIM) 100 MG tablet Take 100 mg by mouth daily.    Marland Kitchen apixaban (ELIQUIS) 5 MG TABS tablet Take 1 tablet (5 mg total) by mouth 2 (two) times daily. 60 tablet 0  . aspirin EC 81 MG tablet Take 81 mg by mouth daily.    . budesonide-formoterol (SYMBICORT) 160-4.5 MCG/ACT inhaler Inhale 2 puffs into the lungs 2 (two) times daily.    . cetirizine (ZYRTEC) 10 MG tablet Take 10 mg by mouth daily.    . diclofenac (VOLTAREN) 75 MG EC tablet Take 75 mg by mouth 2 (two) times daily.    Marland Kitchen docusate sodium (COLACE) 100 MG capsule Take 1 capsule (100 mg total) by mouth 2 (two) times daily. While taking narcotic pain medicine. 30 capsule 0  . DOXYCYCLINE PO Take 100 mg by mouth.    Marland Kitchen HYDROcodone-acetaminophen (NORCO) 10-325 MG tablet Take 1 tablet by mouth every 6 (six) hours as needed (for pain).    Marland Kitchen lisinopril (PRINIVIL,ZESTRIL) 10 MG tablet Take 10 mg by mouth daily.     Marland Kitchen loperamide (IMODIUM) 2 MG capsule Take 2 mg by mouth 4 (four) times daily as needed for diarrhea or loose stools.    . naloxone (NARCAN) nasal spray 4  mg/0.1 mL Place 1 spray into the nose daily as needed.    Marland Kitchen omeprazole (PRILOSEC) 20 MG capsule Take 20 mg by mouth daily.    . pregabalin (LYRICA) 75 MG capsule Take 75 mg by mouth 3 (three) times daily.    . ranitidine (ZANTAC) 150 MG tablet Take 150 mg by mouth daily.    Marland Kitchen senna (SENOKOT) 8.6 MG TABS tablet Take 2 tablets (17.2 mg total) by mouth 2 (two) times daily. 30 each 0  . tadalafil (CIALIS) 10 MG tablet Take 10 mg by mouth daily as needed for erectile dysfunction.    . metoprolol tartrate (LOPRESSOR) 25 MG tablet Take 1 tablet (25 mg total) by mouth 2 (two) times daily. 60 tablet 11   No current facility-administered medications for this visit.     No Known Allergies  Social History   Socioeconomic History    . Marital status: Married    Spouse name: Not on file  . Number of children: Not on file  . Years of education: Not on file  . Highest education level: Not on file  Occupational History  . Not on file  Social Needs  . Financial resource strain: Not on file  . Food insecurity:    Worry: Not on file    Inability: Not on file  . Transportation needs:    Medical: Not on file    Non-medical: Not on file  Tobacco Use  . Smoking status: Current Every Day Smoker    Packs/day: 1.00    Years: 40.00    Pack years: 40.00    Types: Cigarettes  . Smokeless tobacco: Never Used  Substance and Sexual Activity  . Alcohol use: Not Currently    Comment: occasionally  . Drug use: No  . Sexual activity: Not on file  Lifestyle  . Physical activity:    Days per week: Not on file    Minutes per session: Not on file  . Stress: Not on file  Relationships  . Social connections:    Talks on phone: Not on file    Gets together: Not on file    Attends religious service: Not on file    Active member of club or organization: Not on file    Attends meetings of clubs or organizations: Not on file    Relationship status: Not on file  . Intimate partner violence:    Fear of current or ex partner: Not on file    Emotionally abused: Not on file    Physically abused: Not on file    Forced sexual activity: Not on file  Other Topics Concern  . Not on file  Social History Narrative  . Not on file    Family History  Problem Relation Age of Onset  . CVA Mother     Review of Systems:  As stated in the HPI and otherwise negative.   BP 136/80   Pulse (!) 51   Ht 5\' 9"  (1.753 m)   Wt (!) 309 lb 12.8 oz (140.5 kg)   SpO2 95%   BMI 45.75 kg/m   Physical Examination: General: Well developed, well nourished, NAD  HEENT: OP clear, mucus membranes moist  SKIN: warm, dry. No rashes. Neuro: No focal deficits  Musculoskeletal: Muscle strength 5/5 all ext  Psychiatric: Mood and affect normal  Neck:  No JVD, no carotid bruits, no thyromegaly, no lymphadenopathy.  Lungs:Clear bilaterally, no wheezes, rhonci, crackles Cardiovascular: Regular rate and rhythm. No murmurs, gallops or rubs. Abdomen:Soft.  Bowel sounds present. Non-tender.  Extremities: No lower extremity edema. Pulses are 2 + in the bilateral DP/PT.  EKG:  EKG is ordered today. The ekg ordered today demonstrates Sinus brady, 1st degree AV block. PAC  Recent Labs: 07/18/2018: B Natriuretic Peptide 1,433.6 07/20/2018: ALT 36; BUN 16; Creatinine, Ser 0.93; Hemoglobin 11.3; Magnesium 2.2; Platelets 151; Potassium 3.7; Sodium 139; TSH 2.563   Lipid Panel    Component Value Date/Time   CHOL 163 07/19/2018 0621   TRIG 190 (H) 07/19/2018 0621   HDL 10 (L) 07/19/2018 0621   CHOLHDL 16.3 07/19/2018 0621   VLDL 38 07/19/2018 0621   LDLCALC 115 (H) 07/19/2018 0621     Wt Readings from Last 3 Encounters:  08/10/18 (!) 309 lb 12.8 oz (140.5 kg)  07/20/18 (!) 304 lb 12.8 oz (138.3 kg)  07/18/18 (!) 307 lb (139.3 kg)     Other studies Reviewed: Additional studies/ records that were reviewed today include: hospital and office records.  Review of the above records demonstrates:   Assessment and Plan:   1. Atrial fibrillation, paroxysmal: He is in sinus brady today. Continue metoprolol and Eliquis.    2. Tobacco abuse: Smoking cessation is advised. I have reviewed the risk of CAD with ongoing tobacco abuse.   3. HTN: BP controlled.   Current medicines are reviewed at length with the patient today.  The patient does not have concerns regarding medicines.  The following changes have been made:  no change  Labs/ tests ordered today include:  No orders of the defined types were placed in this encounter.  Disposition:   FU with me in 12 months  Signed, Verne Carrow, MD 08/10/2018 10:13 AM    [facility] [address] [place], [place], [place]  [address] Phone: [phone]; Fax: [phone]

## 2018-08-10 NOTE — Addendum Note (Signed)
Addended by: Vernard Gambles on: 08/10/2018 04:16 PM   Modules accepted: Orders

## 2018-08-10 NOTE — Telephone Encounter (Signed)
I spoke with pt who confirms he is taking metoprolol tartrate 25 mg by mouth twice daily.  Will update med list

## 2018-08-10 NOTE — Patient Instructions (Addendum)

## 2018-08-12 ENCOUNTER — Institutional Professional Consult (permissible substitution): Payer: Medicare Other | Admitting: Pulmonary Disease

## 2019-02-14 ENCOUNTER — Encounter (HOSPITAL_COMMUNITY): Payer: Self-pay | Admitting: Student

## 2019-02-14 ENCOUNTER — Other Ambulatory Visit: Payer: Self-pay

## 2019-02-14 ENCOUNTER — Emergency Department (HOSPITAL_COMMUNITY)
Admission: EM | Admit: 2019-02-14 | Discharge: 2019-02-14 | Disposition: A | Discharge status: Home / Self Care | Payer: Medicare Other | Attending: Emergency Medicine | Admitting: Emergency Medicine

## 2019-02-14 DIAGNOSIS — I4891 Unspecified atrial fibrillation: Secondary | ICD-10-CM | POA: Insufficient documentation

## 2019-02-14 DIAGNOSIS — Y999 Unspecified external cause status: Secondary | ICD-10-CM | POA: Insufficient documentation

## 2019-02-14 DIAGNOSIS — M25562 Pain in left knee: Secondary | ICD-10-CM

## 2019-02-14 DIAGNOSIS — X500XXA Overexertion from strenuous movement or load, initial encounter: Secondary | ICD-10-CM | POA: Insufficient documentation

## 2019-02-14 DIAGNOSIS — S8992XD Unspecified injury of left lower leg, subsequent encounter: Secondary | ICD-10-CM | POA: Diagnosis present

## 2019-02-14 DIAGNOSIS — F17210 Nicotine dependence, cigarettes, uncomplicated: Secondary | ICD-10-CM | POA: Insufficient documentation

## 2019-02-14 DIAGNOSIS — I10 Essential (primary) hypertension: Secondary | ICD-10-CM | POA: Insufficient documentation

## 2019-02-14 DIAGNOSIS — Z79899 Other long term (current) drug therapy: Secondary | ICD-10-CM | POA: Diagnosis not present

## 2019-02-14 DIAGNOSIS — Z7901 Long term (current) use of anticoagulants: Secondary | ICD-10-CM | POA: Insufficient documentation

## 2019-02-14 DIAGNOSIS — Y9301 Activity, walking, marching and hiking: Secondary | ICD-10-CM | POA: Insufficient documentation

## 2019-02-14 DIAGNOSIS — Y929 Unspecified place or not applicable: Secondary | ICD-10-CM | POA: Insufficient documentation

## 2019-02-14 NOTE — Discharge Instructions (Addendum)
Please read and follow all provided instructions.  You have been seen today for knee pain.   Home care instructions: -- *PRICE in the first 24-48 hours Protect (with brace, splint, sling), if given by your provider Rest Ice- Do not apply ice pack directly to your skin, place towel or similar between your skin and ice/ice pack. Apply ice for 20 min, then remove for 40 min while awake Compression- Wear brace, elastic bandage, splint as directed by your provider Elevate affected extremity above the level of your heart when not walking around for the first 24-48 hours   Use knee immobilizer for stability.   Medications:  Please continue taking your prior medicines as prescribed.   Follow-up instructions: Please follow up with orthopedics within 1 week, call today for an appointment.   Return instructions:  Please return if your digits or extremity are numb or tingling, appear gray or blue, or you have severe pain (also elevate the extremity and loosen splint or wrap if you were given one) Please return if you have redness or fevers.  Please return to the Emergency Department if you experience worsening symptoms.  Please return if you have any other emergent concerns. Additional Information:  Your vital signs today were: BP (!) 170/96    Pulse 61    Temp 97.8 F (36.6 C) (Oral)    Resp 18    SpO2 99%  If your blood pressure (BP) was elevated above 135/85 this visit, please have this repeated by your doctor within one month. ---------------

## 2019-02-14 NOTE — Progress Notes (Signed)
Orthopedic Tech Progress Note Patient Details:  Frank May [birth year] [id number]  Ortho Devices Type of Ortho Device: Knee Immobilizer Ortho Device/Splint Location: left Ortho Device/Splint Interventions: Application   Post Interventions Patient Tolerated: Well Instructions Provided: Care of device   [place] Pink 02/14/2019, 1:42 PM

## 2019-02-14 NOTE — ED Provider Notes (Signed)
[facility] Provider Note   CSN: [id number] Arrival date & time: 02/14/19  1215     History   Chief Complaint Chief Complaint  Patient presents with  . Knee Pain    HPI Frank CadetCharles A Zimny is a 58 y.o. male with a hx of afib on eliquis, asthma, GERD, COPD, & gout of the R knee who presents to the ED with complaints of L knee pain x 1 week. Patient states he was walking down the stairs, pivoted w/ the LLE & felt a pop in the L knee. Initially was not having significant discomfort but gradually developed pain/swelling. Sxs constant, worse with movement & weightbearing, alleviated some w/ hydrocodone, allopurinol, & topical diclofenac, but does not resolve. Seen @ UC and had x-rays which showed arthritis, but no fx/dislocation per patient report. Denies fever, chills, numbness, or weakness.      HPI  Past Medical History:  Diagnosis Date  . A-fib (HCC)   . Arthritis    right knee, right hip, right shoulder  . 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 Asthma   . GERD (gastroesophageal reflux disease)   . History of COPD   . History of gout    right knee  . Hypertension    states under control with med., has been on med. > 20 yr.  . 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 CAP (community acquired pneumonia) 07/18/2018  . COPD (chronic obstructive pulmonary disease) (HCC) 07/18/2018  . Acute on chronic respiratory failure with hypoxia (HCC) 07/18/2018  . Acute CHF (congestive heart failure) (HCC) 07/18/2018  . Essential hypertension 07/18/2018  . S/P hardware removal 10/29/2016    Past Surgical History:  Procedure Laterality Date  . CLOSED REDUCTION FOOT DISLOCATION Left   . FOOT ARTHRODESIS Right 10/29/2016   Procedure: Right subtalar arthrodesis;  Surgeon: Toni ArthursHewitt, John, MD;  Location: [facility];  Service: Orthopedics;  Laterality: Right;  . FOOT  HARDWARE REMOVAL Right 2017  . FOOT SURGERY Right    5-6 x  . HARDWARE REMOVAL Right 10/29/2016   Procedure: Right ankle removal of deep implant;  Surgeon: Toni ArthursHewitt, John, MD;  Location: [facility];  Service: Orthopedics;  Laterality: Right;  . NOSE SURGERY     x 3  . ORIF FOOT FRACTURE Right 1990        Home Medications    Prior to Admission medications   Medication Sig Start Date End Date Taking? Authorizing Provider  albuterol (PROVENTIL HFA;VENTOLIN HFA) 108 (90 Base) MCG/ACT inhaler Inhale 2 puffs into the lungs every 6 (six) hours as needed for wheezing or shortness of breath.     [provider]  albuterol (PROVENTIL) (2.5 MG/3ML) 0.083% nebulizer solution Take 2.5 mg by nebulization 3 (three) times daily as needed for wheezing or shortness of breath.    [provider]  allopurinol (ZYLOPRIM) 100 MG tablet Take 100 mg by mouth daily.    [provider]  apixaban (ELIQUIS) 5 MG TABS tablet Take 1 tablet (5 mg total) by mouth 2 (two) times daily. 07/20/18   Swayze, Ava, DO  aspirin EC 81 MG tablet Take 81 mg by mouth daily.    [provider]  budesonide-formoterol (SYMBICORT) 160-4.5 MCG/ACT inhaler Inhale 2 puffs into the lungs 2 (two) times daily. 07/28/18 07/28/19  [provider]  cetirizine (ZYRTEC) 10 MG tablet Take 10  mg by mouth daily.    [provider]  diclofenac (VOLTAREN) 75 MG EC tablet Take 75 mg by mouth 2 (two) times daily.    [provider]  docusate sodium (COLACE) 100 MG capsule Take 1 capsule (100 mg total) by mouth 2 (two) times daily. While taking narcotic pain medicine. 10/29/16   Corky Sing, PA-C  DOXYCYCLINE PO Take 100 mg by mouth.    [provider]  HYDROcodone-acetaminophen (NORCO) 10-325 MG tablet Take 1 tablet by mouth every 6 (six) hours as needed (for pain).    [provider]  lisinopril (PRINIVIL,ZESTRIL) 10 MG tablet Take 10 mg by mouth daily.      [provider]  loperamide (IMODIUM) 2 MG capsule Take 2 mg by mouth 4 (four) times daily as needed for diarrhea or loose stools.    [provider]  metoprolol tartrate (LOPRESSOR) 25 MG tablet Take 1 tablet (25 mg total) by mouth 2 (two) times daily. 08/10/18   Burnell Blanks, MD  naloxone [facility]) nasal spray 4 mg/0.1 mL Place 1 spray into the nose daily as needed.    [provider]  omeprazole (PRILOSEC) 20 MG capsule Take 20 mg by mouth daily. 07/28/18   [provider]  pregabalin (LYRICA) 75 MG capsule Take 75 mg by mouth 3 (three) times daily. 07/11/18   [provider]  ranitidine (ZANTAC) 150 MG tablet Take 150 mg by mouth daily.    [provider]  senna (SENOKOT) 8.6 MG TABS tablet Take 2 tablets (17.2 mg total) by mouth 2 (two) times daily. 10/29/16   Corky Sing, PA-C  tadalafil (CIALIS) 10 MG tablet Take 10 mg by mouth daily as needed for erectile dysfunction.    [provider]    Family History Family History  Problem Relation Age of Onset  . CVA Mother     Social History Social History   Tobacco Use  . Smoking status: Current Every Day Smoker    Packs/day: 1.00    Years: 40.00    Pack years: 40.00    Types: Cigarettes  . Smokeless tobacco: Never Used  Substance Use Topics  . Alcohol use: Not Currently    Comment: occasionally  . Drug use: No     Allergies   Patient has no known allergies.   Review of Systems Review of Systems  Constitutional: Negative for chills and fever.  Respiratory: Negative for shortness of breath.   Cardiovascular: Negative for chest pain.  Musculoskeletal: Positive for arthralgias and joint swelling.  Skin: Negative for color change.  Neurological: Negative for weakness and numbness.   Physical Exam Updated Vital Signs BP (!) 170/96   Pulse 61   Temp 97.8 F (36.6 C) (Oral)   Resp 18   SpO2 99%   Physical Exam Vitals signs and nursing note  reviewed.  Constitutional:      General: He is not in acute distress.    Appearance: He is not ill-appearing or toxic-appearing.  HENT:     Head: Normocephalic and atraumatic.  Cardiovascular:     Pulses:          Dorsalis pedis pulses are 2+ on the right side and 2+ on the left side.       Posterior tibial pulses are 2+ on the right side and 2+ on the left side.  Pulmonary:     Effort: Pulmonary effort is normal.  Musculoskeletal:     Comments: Lower extremities: No obvious  deformity,  edema, erythema, ecchymosis, warmth, or open wounds. Mild swelling to L anterior knee, no obvious effusion, exam somewhat limited by body habitus.. Patient has intact AROM to bilateral hips, knees, ankles, and all digits w/ the exception of L knee flexion- able to flex just past 90 degrees, able to fully extend. Tender to palpation to medial/lateral joint line & anterior knee including patella and tibial tuberosity. Otherwise nontender. No calf tenderness. No obvious joint instability.   Skin:    General: Skin is warm and dry.     Capillary Refill: Capillary refill takes less than 2 seconds.  Neurological:     Mental Status: He is alert.     Comments: Alert. Clear speech. Sensation grossly intact to bilateral lower extremities. 5/5 strength with plantar/dorsiflexion bilaterally. Patient ambulatory with antalgic gait.   Psychiatric:        Mood and Affect: Mood normal.        Behavior: Behavior normal.     ED Treatments / Results  Labs (all labs ordered are listed, but only abnormal results are displayed) Labs Reviewed - No data to display  EKG None  Radiology No results found.  Procedures Procedures (including critical care time)  Medications Ordered in ED Medications - No data to display   Initial Impression / Assessment and Plan / ED Course  I have reviewed the triage vital signs and the nursing notes.  Pertinent labs & imaging results that were available during my care of the patient  were reviewed by me and considered in my medical decision making (see chart for details).   Patient presents to the ED w/ complaints of L knee pain s/p popping sensation w/ pivot 1 week prior. Seen @ UC and had xray w/ arthritis- no fx/dislocation per patient report. No fever, erythema, warmth or significant limitation in ROM to indicate septic joint, additionally w/ popping sensation being trigger gout seems less likely. No calf tenderness or edema to indicate DVT. Without direct trauma and recent xray negative for acute process per the patient do not feel this needs repeating. Possible ligamentous/meniscal injury, will place in knee immobilizer, continue current med management, RICE, & orthopedics follow up. BP elevated- doubt HTN emergency, PCP follow up.  I discussed  treatment plan, need for follow-up, and return precautions with the patient. Provided opportunity for questions, patient confirmed understanding and is in agreement with plan.    Final Clinical Impressions(s) / ED Diagnoses   Final diagnoses:  Acute pain of left knee    ED Discharge Orders    None       Cherly Anderson, PA-C 02/14/19 1335    Cathren Laine, MD 02/14/19 1537

## 2019-02-14 NOTE — ED Notes (Signed)
Patient verbalized understanding of discharge instructions and denies any further needs or questions at this time. VS stable. Patient ambulatory with steady gait, knee immobilizer in place - reports pain has improved already.

## 2019-02-14 NOTE — ED Triage Notes (Signed)
Pt in c/o R knee pain onset x 7 days ago, pt reports hearing a pop while walking, pt seen at Urgent Care for the symptoms with an xray then, pt ambulatory with pain, A&O x4, skin intact, swelling present

## 2019-02-14 NOTE — ED Triage Notes (Signed)
Pt states, "I took a norco 10 and Alpurinol 45 minutes ago and an arthritic pain pill."

## 2019-06-27 ENCOUNTER — Emergency Department (HOSPITAL_COMMUNITY)
Admission: EM | Admit: 2019-06-27 | Discharge: 2019-06-27 | Disposition: A | Discharge status: Home / Self Care | Payer: Medicare Other | Attending: Emergency Medicine | Admitting: Emergency Medicine

## 2019-06-27 ENCOUNTER — Encounter (HOSPITAL_COMMUNITY): Payer: Self-pay | Admitting: Emergency Medicine

## 2019-06-27 ENCOUNTER — Other Ambulatory Visit: Payer: Self-pay

## 2019-06-27 DIAGNOSIS — R059 Cough, unspecified: Secondary | ICD-10-CM

## 2019-06-27 DIAGNOSIS — J449 Chronic obstructive pulmonary disease, unspecified: Secondary | ICD-10-CM | POA: Insufficient documentation

## 2019-06-27 DIAGNOSIS — Z20822 Contact with and (suspected) exposure to covid-19: Secondary | ICD-10-CM | POA: Diagnosis not present

## 2019-06-27 DIAGNOSIS — F17210 Nicotine dependence, cigarettes, uncomplicated: Secondary | ICD-10-CM | POA: Insufficient documentation

## 2019-06-27 DIAGNOSIS — Z7901 Long term (current) use of anticoagulants: Secondary | ICD-10-CM | POA: Diagnosis not present

## 2019-06-27 DIAGNOSIS — I110 Hypertensive heart disease with heart failure: Secondary | ICD-10-CM | POA: Insufficient documentation

## 2019-06-27 DIAGNOSIS — I509 Heart failure, unspecified: Secondary | ICD-10-CM | POA: Diagnosis not present

## 2019-06-27 DIAGNOSIS — Z7982 Long term (current) use of aspirin: Secondary | ICD-10-CM | POA: Insufficient documentation

## 2019-06-27 DIAGNOSIS — R05 Cough: Secondary | ICD-10-CM | POA: Insufficient documentation

## 2019-06-27 LAB — SARS CORONAVIRUS 2 (TAT 6-24 HRS): SARS Coronavirus 2: NEGATIVE

## 2019-06-27 NOTE — ED Triage Notes (Signed)
Pt sent by Dr. For COVID test. Pt reports non productive cough. Denies recent fevers. States has some SOB due to smoking and hx of asthma.

## 2019-06-27 NOTE — ED Notes (Signed)
Went in to get patient into gown patient decline getting into the gown call bell in reach

## 2019-06-27 NOTE — ED Provider Notes (Signed)
[facility] EMERGENCY DEPARTMENT Provider Note   CSN: [id number] Arrival date & time: 06/27/19  1031     History Chief Complaint  Patient presents with  . Cough    Frank May is a 59 y.o. male with PMH significant for asthma, atrial fibrillation not currently anticoagulated, HTN, and COPD who presents to the ED for COVID-19 testing.  Patient reports that he has had a nonproductive cough x1 week and his primary care provider at [facility] sent him for COVID-19 testing.  He reports that he was unable to find an outpatient testing site as he is unfamiliar with [place].  He denies any fevers or chills, diminished appetite, chest pain or difficulty breathing, nausea or vomiting, urinary symptoms, or any changes in bowel habits.  He reports that he has been unable to smell, but that is chronic due to breaking his nose multiple times.  He is sitting comfortably in a chair and eager to leave the ER as he "feels fine".  He also feels as though his cough is improved since he began taking antibiotics approximately 1 week ago.  He believes that he may have caught something from his 25-year-old granddaughter who also recently had a cough.  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  HPI     Past Medical History:  Diagnosis Date  . A-fib (HCC)   . Arthritis    right knee, right hip, right shoulder  . 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 Asthma   . GERD (gastroesophageal reflux disease)   . History of COPD   . History of gout    right knee  . Hypertension    states under control with med., has been on med. > 20 yr.  . 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 CAP (community acquired pneumonia) 07/18/2018  . COPD (chronic obstructive pulmonary disease) (HCC) 07/18/2018  . Acute on chronic respiratory failure with hypoxia (HCC) 07/18/2018  . Acute CHF  (congestive heart failure) (HCC) 07/18/2018  . Essential hypertension 07/18/2018  . S/P hardware removal 10/29/2016    Past Surgical History:  Procedure Laterality Date  . CLOSED REDUCTION FOOT DISLOCATION Left   . FOOT ARTHRODESIS Right 10/29/2016   Procedure: Right subtalar arthrodesis;  Surgeon: Toni Arthurs, MD;  Location: [facility];  Service: Orthopedics;  Laterality: Right;  . FOOT HARDWARE REMOVAL Right 2017  . FOOT SURGERY Right    5-6 x  . HARDWARE REMOVAL Right 10/29/2016   Procedure: Right ankle removal of deep implant;  Surgeon: Toni Arthurs, MD;  Location: [facility];  Service: Orthopedics;  Laterality: Right;  . NOSE SURGERY     x 3  . ORIF FOOT FRACTURE Right 1990       Family History  Problem Relation Age of Onset  . CVA Mother     Social History   Tobacco Use  . Smoking status: Current Every Day Smoker    Packs/day: 1.00    Years: 40.00    Pack years: 40.00    Types: Cigarettes  . Smokeless tobacco: Never Used  Substance Use Topics  . Alcohol use: Not Currently    Comment: occasionally  . Drug use: No    Home Medications Prior to Admission medications   Medication Sig Start Date End Date Taking? Authorizing Provider  albuterol (PROVENTIL HFA;VENTOLIN HFA) 108 (  90 Base) MCG/ACT inhaler Inhale 2 puffs into the lungs every 6 (six) hours as needed for wheezing or shortness of breath.     [provider]  albuterol (PROVENTIL) (2.5 MG/3ML) 0.083% nebulizer solution Take 2.5 mg by nebulization 3 (three) times daily as needed for wheezing or shortness of breath.    [provider]  allopurinol (ZYLOPRIM) 100 MG tablet Take 100 mg by mouth daily.    [provider]  apixaban (ELIQUIS) 5 MG TABS tablet Take 1 tablet (5 mg total) by mouth 2 (two) times daily. 07/20/18   Swayze, Ava, DO  aspirin EC 81 MG tablet Take 81 mg by mouth daily.    [provider]  budesonide-formoterol (SYMBICORT) 160-4.5  MCG/ACT inhaler Inhale 2 puffs into the lungs 2 (two) times daily. 07/28/18 07/28/19  [provider]  cetirizine (ZYRTEC) 10 MG tablet Take 10 mg by mouth daily.    [provider]  diclofenac (VOLTAREN) 75 MG EC tablet Take 75 mg by mouth 2 (two) times daily.    [provider]  docusate sodium (COLACE) 100 MG capsule Take 1 capsule (100 mg total) by mouth 2 (two) times daily. While taking narcotic pain medicine. 10/29/16   Jacinta Shoe, PA-C  DOXYCYCLINE PO Take 100 mg by mouth.    [provider]  HYDROcodone-acetaminophen (NORCO) 10-325 MG tablet Take 1 tablet by mouth every 6 (six) hours as needed (for pain).    [provider]  lisinopril (PRINIVIL,ZESTRIL) 10 MG tablet Take 10 mg by mouth daily.     [provider]  loperamide (IMODIUM) 2 MG capsule Take 2 mg by mouth 4 (four) times daily as needed for diarrhea or loose stools.    [provider]  metoprolol tartrate (LOPRESSOR) 25 MG tablet Take 1 tablet (25 mg total) by mouth 2 (two) times daily. 08/10/18   [name], MD  naloxone [facility]) nasal spray 4 mg/0.1 mL Place 1 spray into the nose daily as needed.    [provider]  omeprazole (PRILOSEC) 20 MG capsule Take 20 mg by mouth daily. 07/28/18   [provider]  pregabalin (LYRICA) 75 MG capsule Take 75 mg by mouth 3 (three) times daily. 07/11/18   [provider]  ranitidine (ZANTAC) 150 MG tablet Take 150 mg by mouth daily.    [provider]  senna (SENOKOT) 8.6 MG TABS tablet Take 2 tablets (17.2 mg total) by mouth 2 (two) times daily. 10/29/16   Jacinta Shoe, PA-C  tadalafil (CIALIS) 10 MG tablet Take 10 mg by mouth daily as needed for erectile dysfunction.    [provider]    Allergies    Patient has no known allergies.  Review of Systems   Review of Systems  Constitutional: Negative for chills and fever.  Respiratory: Positive for cough. Negative  for shortness of breath.   Cardiovascular: Negative for chest pain.  Musculoskeletal: Negative for myalgias.    Physical Exam Updated Vital Signs BP (!) 163/95 (BP Location: Left Arm)   Pulse 63   Temp 97.7 F (36.5 C) (Oral)   Resp 16   Ht 5\' 10"  (1.778 m)   Wt (!) 144.7 kg   SpO2 100%   BMI 45.77 kg/m   Physical Exam Vitals and nursing note reviewed. Exam conducted with a chaperone present.  Constitutional:      Appearance: Normal appearance.  HENT:     Head: Normocephalic and atraumatic.  Eyes:  General: No scleral icterus.    Conjunctiva/sclera: Conjunctivae normal.  Cardiovascular:     Rate and Rhythm: Normal rate and regular rhythm.     Pulses: Normal pulses.     Heart sounds: Normal heart sounds.  Pulmonary:     Comments: Normal respiratory effort.  Mild wheezing auscultated bilaterally.  No accessory muscle use.  No respiratory distress. Abdominal:     General: Abdomen is flat. There is no distension.     Palpations: Abdomen is soft.     Tenderness: There is no abdominal tenderness. There is no guarding.  Musculoskeletal:     Cervical back: Normal range of motion and neck supple. No rigidity.     Right lower leg: No edema.     Left lower leg: No edema.  Skin:    General: Skin is dry.     Capillary Refill: Capillary refill takes less than 2 seconds.  Neurological:     Mental Status: He is alert.     GCS: GCS eye subscore is 4. GCS verbal subscore is 5. GCS motor subscore is 6.  Psychiatric:        Mood and Affect: Mood normal.        Behavior: Behavior normal.        Thought Content: Thought content normal.      ED Results / Procedures / Treatments   Labs (all labs ordered are listed, but only abnormal results are displayed) Labs Reviewed  SARS CORONAVIRUS 2 (TAT 6-24 HRS)    EKG None  Radiology No results found.  Procedures Procedures (including critical care time)  Medications Ordered in ED Medications - No data to display  ED  Course  I have reviewed the triage vital signs and the nursing notes.  Pertinent labs & imaging results that were available during my care of the patient were reviewed by me and considered in my medical decision making (see chart for details).    MDM Rules/Calculators/A&P                      Patient is here purely for COVID-19 testing.  He does not feel as though he is in any acute distress.  He in fact reports that he is feeling much improved since symptom onset 7 days ago.  He is only here because it is [facility] PCP wanted him to get tested for COVID-19 and he was unable to find or secure outpatient testing site.  He denies any fevers, diminished appetite, difficulty breathing, chest pain, or any other concerning history.  His physical exam was benign.  Given chronicity of his cough, offered chest x-ray which he declined.  He has already been taking antibiotics, with reported improvement.  He also states that he has his albuterol and other asthma medications at home and does not require refill at this time.  While he had mild wheezing auscultated bilaterally on exam, he states that this is no more than usual.  He is hemodynamically stable and sitting comfortably on exam.  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  Discussed them with him, regardless.  Recommended over-the-counter medications for symptomatic relief of his cough.  Patient voices understanding and is agreeable to the plan.  He plans to follow-up with his PCP regarding today's encounter.  Frank May was evaluated in Emergency Department on 06/27/2019 for the symptoms described in the history of present illness. He was evaluated in the context of the global  COVID-19 pandemic, which necessitated consideration that the patient might be at risk for infection with the SARS-CoV-2 virus that causes COVID-19. Institutional protocols and algorithms that pertain to the evaluation of patients at risk for  COVID-19 are in a state of rapid change based on information released by regulatory bodies including the CDC and federal and state organizations. These policies and algorithms were followed during the patient's care in the ED.   Final Clinical Impression(s) / ED Diagnoses Final diagnoses:  Cough    Rx / DC Orders ED Discharge Orders    None       Corena Herter, PA-C 06/27/19 1145    Hayden Rasmussen, MD 06/27/19 1920

## 2019-06-27 NOTE — Discharge Instructions (Addendum)
Please read the attachment on COVID-19.  Please continue to follow isolation precautions, pending results of your COVID-19 testing.  Please return to the ED or seek medical attention immediately for any new or worsening symptoms.  Otherwise, follow-up with your primary care provider regarding today's encounter.

## 2019-12-17 ENCOUNTER — Emergency Department (HOSPITAL_COMMUNITY)
Admission: EM | Admit: 2019-12-17 | Discharge: 2019-12-17 | Disposition: A | Discharge status: Home / Self Care | Payer: Medicare Other | Attending: Emergency Medicine | Admitting: Emergency Medicine

## 2019-12-17 ENCOUNTER — Emergency Department (HOSPITAL_BASED_OUTPATIENT_CLINIC_OR_DEPARTMENT_OTHER): Payer: Medicare Other

## 2019-12-17 ENCOUNTER — Other Ambulatory Visit: Payer: Self-pay

## 2019-12-17 ENCOUNTER — Emergency Department (HOSPITAL_COMMUNITY): Payer: Medicare Other

## 2019-12-17 ENCOUNTER — Encounter (HOSPITAL_COMMUNITY): Payer: Self-pay | Admitting: Emergency Medicine

## 2019-12-17 DIAGNOSIS — Z7982 Long term (current) use of aspirin: Secondary | ICD-10-CM | POA: Insufficient documentation

## 2019-12-17 DIAGNOSIS — Z79899 Other long term (current) drug therapy: Secondary | ICD-10-CM | POA: Insufficient documentation

## 2019-12-17 DIAGNOSIS — R600 Localized edema: Secondary | ICD-10-CM

## 2019-12-17 DIAGNOSIS — J449 Chronic obstructive pulmonary disease, unspecified: Secondary | ICD-10-CM | POA: Diagnosis not present

## 2019-12-17 DIAGNOSIS — I509 Heart failure, unspecified: Secondary | ICD-10-CM | POA: Diagnosis not present

## 2019-12-17 DIAGNOSIS — F17210 Nicotine dependence, cigarettes, uncomplicated: Secondary | ICD-10-CM | POA: Insufficient documentation

## 2019-12-17 DIAGNOSIS — R2243 Localized swelling, mass and lump, lower limb, bilateral: Secondary | ICD-10-CM | POA: Diagnosis not present

## 2019-12-17 DIAGNOSIS — J45909 Unspecified asthma, uncomplicated: Secondary | ICD-10-CM | POA: Diagnosis not present

## 2019-12-17 DIAGNOSIS — I110 Hypertensive heart disease with heart failure: Secondary | ICD-10-CM | POA: Diagnosis not present

## 2019-12-17 DIAGNOSIS — R609 Edema, unspecified: Secondary | ICD-10-CM

## 2019-12-17 LAB — CBC
HCT: 38.9 % — ABNORMAL LOW (ref 39.0–52.0)
Hemoglobin: 12.4 g/dL — ABNORMAL LOW (ref 13.0–17.0)
MCH: 23.4 pg — ABNORMAL LOW (ref 26.0–34.0)
MCHC: 31.9 g/dL (ref 30.0–36.0)
MCV: 73.4 fL — ABNORMAL LOW (ref 80.0–100.0)
Platelets: 168 10*3/uL (ref 150–400)
RBC: 5.3 MIL/uL (ref 4.22–5.81)
RDW: 16.7 % — ABNORMAL HIGH (ref 11.5–15.5)
WBC: 4.7 10*3/uL (ref 4.0–10.5)
nRBC: 0 % (ref 0.0–0.2)

## 2019-12-17 LAB — BRAIN NATRIURETIC PEPTIDE: B Natriuretic Peptide: 82.3 pg/mL (ref 0.0–100.0)

## 2019-12-17 LAB — BASIC METABOLIC PANEL
Anion gap: 10 (ref 5–15)
BUN: 12 mg/dL (ref 6–20)
CO2: 20 mmol/L — ABNORMAL LOW (ref 22–32)
Calcium: 8.7 mg/dL — ABNORMAL LOW (ref 8.9–10.3)
Chloride: 106 mmol/L (ref 98–111)
Creatinine, Ser: 0.92 mg/dL (ref 0.61–1.24)
GFR calc Af Amer: >60 mL/min (ref >60)
GFR calc non Af Amer: >60 mL/min (ref >60)
Glucose, Bld: 120 mg/dL — ABNORMAL HIGH (ref 70–99)
Potassium: 4.3 mmol/L (ref 3.5–5.1)
Sodium: 136 mmol/L (ref 135–145)

## 2019-12-17 LAB — TROPONIN I (HIGH SENSITIVITY): Troponin I (High Sensitivity): 8 ng/L (ref <18)

## 2019-12-17 MED ORDER — SODIUM CHLORIDE 0.9% FLUSH: 3.0000 mL | Freq: Once | INTRAVENOUS | Status: DC

## 2019-12-17 NOTE — ED Notes (Signed)
Pt refusing bloodwork at this time

## 2019-12-17 NOTE — ED Notes (Signed)
Patient transported to Ultrasound 

## 2019-12-17 NOTE — ED Notes (Signed)
Pt discharge instructions reviewed with the patient. The patient verbalized understanding of instructions. Pt discharged. 

## 2019-12-17 NOTE — Progress Notes (Signed)
VASCULAR LAB PRELIMINARY  PRELIMINARY  PRELIMINARY  PRELIMINARY  Bilateral lower extremity venous duplex completed.    Preliminary report:  See CV proc for preliminary results.  Called report to Evelena Leyden, PA-C  Belina Mandile, RVT 12/17/2019, 1:21 PM

## 2019-12-17 NOTE — ED Provider Notes (Signed)
[facility] EMERGENCY DEPARTMENT Provider Note   CSN: [id number] Arrival date & time: 12/17/19  7829     History Chief Complaint  Patient presents with  . Leg Swelling    Frank May is a 59 y.o. male with past medical history of atrial fibrillation on Eliquis, COPD, and HTN who presents to the ED requesting US DVT study for his bilateral lower extremity edema. Patient evidently refused stat venous Doppler on 12/15/2019 while at the office of his family practitioner. AMA form was signed at that time. He has also been referred to cardiology. Patient reports that his bilateral pedal edema and lower extremity swelling did not begin until after he initiated diuretic therapy.  He states that his legs are in discomfort due to his swelling.  He initiates a history of chronic right leg swelling relative to left due to 9 surgeries involving right lower leg.  On my examination, patient is denying any recent fevers or chills, chest pain or difficulty breathing, cough, pleuritic symptoms, history of clots, clotting disorder, numbness or weakness, or other symptoms.  He has been taking his Eliquis, as directed.  HPI     Past Medical History:  Diagnosis Date  . A-fib (HCC)   . Arthritis    right knee, right hip, right shoulder  . 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 Asthma   . GERD (gastroesophageal reflux disease)   . History of COPD   . History of gout    right knee  . Hypertension    states under control with med., has been on med. > 20 yr.  . 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 CAP (community acquired pneumonia) 07/18/2018  . COPD (chronic obstructive pulmonary disease) (HCC) 07/18/2018  . Acute on chronic respiratory failure with hypoxia (HCC) 07/18/2018  . Acute CHF (congestive heart failure) (HCC) 07/18/2018  . Essential hypertension 07/18/2018  . S/P hardware removal 10/29/2016      Past Surgical History:  Procedure Laterality Date  . CLOSED REDUCTION FOOT DISLOCATION Left   . FOOT ARTHRODESIS Right 10/29/2016   Procedure: Right subtalar arthrodesis;  Surgeon: Toni Arthurs, MD;  Location: [facility];  Service: Orthopedics;  Laterality: Right;  . FOOT HARDWARE REMOVAL Right 2017  . FOOT SURGERY Right    5-6 x  . HARDWARE REMOVAL Right 10/29/2016   Procedure: Right ankle removal of deep implant;  Surgeon: Toni Arthurs, MD;  Location:  SURGERY CENTER;  Service: Orthopedics;  Laterality: Right;  . NOSE SURGERY     x 3  . ORIF FOOT FRACTURE Right 1990       Family History  Problem Relation Age of Onset  . CVA Mother     Social History   Tobacco Use  . Smoking status: Current Every Day Smoker    Packs/day: 1.00    Years: 40.00    Pack years: 40.00    Types: Cigarettes  . Smokeless tobacco: Never Used  Vaping Use  . Vaping Use: Never used  Substance Use Topics  . Alcohol use: Not Currently    Comment: occasionally  . Drug use: No    Home Medications Prior to Admission medications   Medication Sig Start Date End Date Taking? Authorizing Provider  albuterol (PROVENTIL HFA;VENTOLIN HFA) 108 (90 Base) MCG/ACT inhaler Inhale 2 puffs into the lungs every 6 (six) hours as needed for wheezing or shortness  of breath.     [provider]  albuterol (PROVENTIL) (2.5 MG/3ML) 0.083% nebulizer solution Take 2.5 mg by nebulization 3 (three) times daily as needed for wheezing or shortness of breath.    [provider]  allopurinol (ZYLOPRIM) 100 MG tablet Take 100 mg by mouth daily.    [provider]  apixaban (ELIQUIS) 5 MG TABS tablet Take 1 tablet (5 mg total) by mouth 2 (two) times daily. 07/20/18   Swayze, Ava, DO  aspirin EC 81 MG tablet Take 81 mg by mouth daily.    [provider]  budesonide-formoterol (SYMBICORT) 160-4.5 MCG/ACT inhaler Inhale 2 puffs into the lungs 2 (two) times daily. 07/28/18  07/28/19  [provider]  cetirizine (ZYRTEC) 10 MG tablet Take 10 mg by mouth daily.    [provider]  diclofenac (VOLTAREN) 75 MG EC tablet Take 75 mg by mouth 2 (two) times daily.    [provider]  docusate sodium (COLACE) 100 MG capsule Take 1 capsule (100 mg total) by mouth 2 (two) times daily. While taking narcotic pain medicine. 10/29/16   Jacinta Shoe, PA-C  DOXYCYCLINE PO Take 100 mg by mouth.    [provider]  HYDROcodone-acetaminophen (NORCO) 10-325 MG tablet Take 1 tablet by mouth every 6 (six) hours as needed (for pain).    [provider]  lisinopril (PRINIVIL,ZESTRIL) 10 MG tablet Take 10 mg by mouth daily.     [provider]  loperamide (IMODIUM) 2 MG capsule Take 2 mg by mouth 4 (four) times daily as needed for diarrhea or loose stools.    [provider]  metoprolol tartrate (LOPRESSOR) 25 MG tablet Take 1 tablet (25 mg total) by mouth 2 (two) times daily. 08/10/18   [name], MD  naloxone [facility]) nasal spray 4 mg/0.1 mL Place 1 spray into the nose daily as needed.    [provider]  omeprazole (PRILOSEC) 20 MG capsule Take 20 mg by mouth daily. 07/28/18   [provider]  pregabalin (LYRICA) 75 MG capsule Take 75 mg by mouth 3 (three) times daily. 07/11/18   [provider]  ranitidine (ZANTAC) 150 MG tablet Take 150 mg by mouth daily.    [provider]  senna (SENOKOT) 8.6 MG TABS tablet Take 2 tablets (17.2 mg total) by mouth 2 (two) times daily. 10/29/16   Jacinta Shoe, PA-C  tadalafil (CIALIS) 10 MG tablet Take 10 mg by mouth daily as needed for erectile dysfunction.    [provider]    Allergies    Patient has no known allergies.  Review of Systems   Review of Systems  All other systems reviewed and are negative.   Physical Exam Updated Vital Signs BP (!) 165/78 (BP Location: Right Arm)   Pulse 60   Temp 98.5 F (36.9 C)  (Oral)   Resp 20   Ht  (1.778 m)   Wt (!) 151.5 kg   SpO2 98%   BMI 47.92 kg/m   Physical Exam Vitals and nursing note reviewed. Exam conducted with a chaperone present.  Constitutional:      General: He is not in acute distress.    Appearance: Normal appearance. He is not ill-appearing.  HENT:     Head: Normocephalic and atraumatic.  Eyes:     General: No scleral icterus.    Conjunctiva/sclera: Conjunctivae normal.  Cardiovascular:     Rate and Rhythm: Normal rate and regular rhythm.  Pulses: Normal pulses.     Heart sounds: Normal heart sounds.  Pulmonary:     Effort: Pulmonary effort is normal. No respiratory distress.     Breath sounds: Normal breath sounds. No wheezing or rales.  Musculoskeletal:     Comments: Swelling and pitting edema noted in lower extremities bilaterally.  2+ pedal pitting edema.  Pedal pulses intact.  ROM, strength, and sensation intact throughout.  Negative Homans' sign.  No calf tenderness to palpation.  No weeping, erythema, warmth, or other overlying skin changes.  Skin:    General: Skin is dry.     Capillary Refill: Capillary refill takes less than 2 seconds.  Neurological:     General: No focal deficit present.     Mental Status: He is alert and oriented to person, place, and time.     GCS: GCS eye subscore is 4. GCS verbal subscore is 5. GCS motor subscore is 6.     Cranial Nerves: No cranial nerve deficit.     Sensory: No sensory deficit.     Motor: No weakness.     Coordination: Coordination normal.     Gait: Gait normal.  Psychiatric:        Mood and Affect: Mood normal.        Behavior: Behavior normal.        Thought Content: Thought content normal.     ED Results / Procedures / Treatments   Labs (all labs ordered are listed, but only abnormal results are displayed) Labs Reviewed  BASIC METABOLIC PANEL - Abnormal; Notable for the following components:      Result Value   CO2 20 (*)    Glucose, Bld 120 (*)    Calcium  8.7 (*)    All other components within normal limits  CBC - Abnormal; Notable for the following components:   Hemoglobin 12.4 (*)    HCT 38.9 (*)    MCV 73.4 (*)    MCH 23.4 (*)    RDW 16.7 (*)    All other components within normal limits  BRAIN NATRIURETIC PEPTIDE  TROPONIN I (HIGH SENSITIVITY)    EKG None  Radiology DG Chest 2 View  Result Date: 12/17/2019 CLINICAL DATA:  Lower extremity edema. EXAM: CHEST - 2 VIEW COMPARISON:  07/18/2018 FINDINGS: Midline trachea. No pleural effusion or pneumothorax. Diffuse peribronchial thickening. Mild left base scarring. IMPRESSION: No acute cardiopulmonary disease. Peribronchial thickening which may relate to chronic bronchitis or smoking. Electronically Signed   By: Jeronimo Greaves M.D.   On: 12/17/2019 09:10   VAS [place] LOWER EXTREMITY VENOUS (DVT) (ONLY [facility] & [facility] 7a-7p)  Result Date: 12/17/2019  Lower Venous DVT Study Indications: Edema.  Limitations: Body habitus and significant edema in the calves. Comparison Study: No prior study on file Performing Technologist: Sherren Kerns RVS  Examination Guidelines: A complete evaluation includes B-mode imaging, spectral Doppler, color Doppler, and power Doppler as needed of all accessible portions of each vessel. Bilateral testing is considered an integral part of a complete examination. Limited examinations for reoccurring indications may be performed as noted. The reflux portion of the exam is performed with the patient in reverse Trendelenburg.  +---------+---------------+---------+-----------+----------+--------------+ RIGHT    CompressibilityPhasicitySpontaneityPropertiesThrombus Aging +---------+---------------+---------+-----------+----------+--------------+ CFV      Full           Yes      Yes                                 +---------+---------------+---------+-----------+----------+--------------+  SFJ      Full                                                         +---------+---------------+---------+-----------+----------+--------------+ FV Prox  Full                                                        +---------+---------------+---------+-----------+----------+--------------+ FV Mid   Full                                                        +---------+---------------+---------+-----------+----------+--------------+ FV DistalFull                                                        +---------+---------------+---------+-----------+----------+--------------+ PFV      Full                                                        +---------+---------------+---------+-----------+----------+--------------+ POP      Full           Yes      Yes                                 +---------+---------------+---------+-----------+----------+--------------+ PTV      Full                                                        +---------+---------------+---------+-----------+----------+--------------+ PERO     Full                                                        +---------+---------------+---------+-----------+----------+--------------+   +---------+---------------+---------+-----------+----------+--------------+ LEFT     CompressibilityPhasicitySpontaneityPropertiesThrombus Aging +---------+---------------+---------+-----------+----------+--------------+ CFV      Full           Yes      Yes                                 +---------+---------------+---------+-----------+----------+--------------+ SFJ      Full                                                        +---------+---------------+---------+-----------+----------+--------------+  FV Prox  Full                                                        +---------+---------------+---------+-----------+----------+--------------+ FV Mid   Full                                                         +---------+---------------+---------+-----------+----------+--------------+ FV DistalFull                                                        +---------+---------------+---------+-----------+----------+--------------+ PFV      Full                                                        +---------+---------------+---------+-----------+----------+--------------+ POP      Full           Yes      Yes                                 +---------+---------------+---------+-----------+----------+--------------+ PTV      Full                                                        +---------+---------------+---------+-----------+----------+--------------+ PERO     Full                                                        +---------+---------------+---------+-----------+----------+--------------+     Summary: BILATERAL: -No evidence of popliteal cyst, bilaterally.   *See table(s) above for measurements and observations.    Preliminary     Procedures Procedures (including critical care time)  Medications Ordered in ED Medications - No data to display  ED Course  I have reviewed the triage vital signs and the nursing notes.  Pertinent labs & imaging results that were available during my care of the patient were reviewed by me and considered in my medical decision making (see chart for details).    MDM Rules/Calculators/A&P                          Labs Troponin: 8, do not need to trend. BNP: 82.3. BMP: Unremarkable. CBC: Mild anemia, consistent with baseline.  Imaging DG chest: I personally reviewed plain films obtained of chest which demonstrates no acute cardiopulmonary findings.  No pleural effusion concerning for CHF exacerbation. DVT ultrasound: Negative for clots.  Patient's laboratory work-up is  entirely unremarkable.  He keeps emphasizing that he was in here purely for DVT study.  He has pitting pedal and lower extremity edema, concerning for possible  CHF.  However, his chest x-ray and BNP are reassuring.  He does not feel short of breath and he denies any chest pain or other symptoms.  His legs feel "heavy", but no significant pain symptoms.  His pulses are intact and symmetric and I have low suspicion for arterial occlusion.  Patient is pleased by reassuring DVT study.  I advised that he wear compression stockings and keep the legs elevated.  He plans to follow-up with his primary care provider for ongoing evaluation and management.  He will need to have his diuretic medications adjusted.   All of the evaluation and work-up results were discussed with the patient and any family at bedside.  Patient and/or family were informed that while patient is appropriate for discharge at this time, some medical emergencies may only develop or become detectable after a period of time.  I specifically instructed patient and/or family to return to return to the ED or seek immediate medical attention for any new or worsening symptoms.  They were provided opportunity to ask any additional questions and have none at this time.  Prior to discharge patient is feeling well, agreeable with plan for discharge home.  They have expressed understanding of verbal discharge instructions as well as return precautions and are agreeable to the plan.     Final Clinical Impression(s) / ED Diagnoses Final diagnoses:  Bilateral lower extremity edema    Rx / DC Orders ED Discharge Orders    None       Lorelee New, PA-C 12/17/19 1355    Geoffery Lyons, MD 12/17/19 [phone]

## 2019-12-17 NOTE — Discharge Instructions (Signed)
Please follow-up with your primary care provider regarding today's encounter and for ongoing evaluation and management.  Given negative DVT studies were negative for clot, you will likely need to have your diuretic medication adjusted.  In the meantime, I recommend that you elevate the legs and consider compression stockings.  Return to the ED or seek immediate medical attention should you experience any new or worsening symptoms.

## 2019-12-17 NOTE — ED Triage Notes (Addendum)
Pt states his PCP told him to come to ED on Friday for "MRI to make sure he didn't have blood clots."  C/o bilateral lower extremity edema x 1 1/2 weeks.  States "since I started the fluid pills".  Reports pain to bottom of feet.  Pt denies SOB or chest pain. Appears to have SOB with exertion.

## 2020-05-30 NOTE — Progress Notes (Deleted)
Cardiology Office Note    Date:  05/30/2020   ID:  Frank May, DOB 1961-02-11, MRN [id number]  PCP:  Domenick Bookbinder, PA-C  Cardiologist: Verne Carrow, MD EPS: None  No chief complaint on file.   History of Present Illness:  Frank May is a 58 y.o. male with history of PAF in the setting of pneumonia 07/2018 on Eliquis and Toprol, HTN,  Lower extremity edema, Tobacco Abuse, COPD. Last saw Dr. Clifton James 07/2018 and doing well. Echo 07/2018 LVEF ? 45-50% but lots of ectopy and poor LV visualization during exam.   Notes from PCP at [facility] 04/02/20 patient said someone told him to stop Eliquis but he didn't know who.  Past Medical History:  Diagnosis Date  . A-fib (HCC)   . Arthritis    right knee, right hip, right shoulder  . 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 Asthma   . GERD (gastroesophageal reflux disease)   . History of COPD   . History of gout    right knee  . Hypertension    states under control with med., has been on med. > 20 yr.  . 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 CLOSED REDUCTION FOOT DISLOCATION Left   . FOOT ARTHRODESIS Right 10/29/2016   Procedure: Right subtalar arthrodesis;  Surgeon: Toni Arthurs, MD;  Location: [facility];  Service: Orthopedics;  Laterality: Right;  . FOOT HARDWARE REMOVAL Right 2017  . FOOT SURGERY Right    5-6 x  . HARDWARE REMOVAL Right 10/29/2016   Procedure: Right ankle removal of deep implant;  Surgeon: Toni Arthurs, MD;  Location: [facility];  Service: Orthopedics;  Laterality: Right;  . NOSE SURGERY     x 3  . ORIF FOOT FRACTURE Right 1990    Current Medications: No outpatient medications have been marked as taking for the 06/05/20 encounter (Appointment) with Dyann Kief, PA-C.     Allergies:   Patient has no known allergies.   Social History   Socioeconomic History  . Marital status:  Married    Spouse name: Not on file  . Number of children: Not on file  . Years of education: Not on file  . Highest education level: Not on file  Occupational History  . Not on file  Tobacco Use  . Smoking status: Current Every Day Smoker    Packs/day: 1.00    Years: 40.00    Pack years: 40.00    Types: Cigarettes  . Smokeless tobacco: Never Used  Vaping Use  . Vaping Use: Never used  Substance and Sexual Activity  . Alcohol use: Not Currently    Comment: occasionally  . Drug use: No  . Sexual activity: Not on file  Other Topics Concern  . Not on file  Social History Narrative  . Not on file   Social Determinants of Health   Financial Resource Strain: Not on file  Food Insecurity: Not on file  Transportation Needs: Not on file  Physical Activity: Not on file  Stress: Not on file  Social Connections: Not on file     Family History:  The patient's ***family history includes CVA in his mother.   ROS:   Please see the history of present illness.    ROS All other systems reviewed and are negative.   PHYSICAL EXAM:   VS:  There were no vitals taken for  this visit.  Physical Exam  GEN: Well nourished, well developed, in no acute distress  HEENT: normal  Neck: no JVD, carotid bruits, or masses Cardiac:RRR; no murmurs, rubs, or gallops  Respiratory:  clear to auscultation bilaterally, normal work of breathing GI: soft, nontender, nondistended, + BS Ext: without cyanosis, clubbing, or edema, Good distal pulses bilaterally MS: no deformity or atrophy  Skin: warm and dry, no rash Neuro:  Alert and Oriented x 3, Strength and sensation are intact Psych: euthymic mood, full affect  Wt Readings from Last 3 Encounters:  12/17/19 (!) 334 lb (151.5 kg)  06/27/19 (!) 319 lb (144.7 kg)  08/10/18 (!) 309 lb 12.8 oz (140.5 kg)      Studies/Labs Reviewed:   EKG:  EKG is*** ordered today.  The ekg ordered today demonstrates ***  Recent Labs: 12/17/2019: B Natriuretic  Peptide 82.3; BUN 12; Creatinine, Ser 0.92; Hemoglobin 12.4; Platelets 168; Potassium 4.3; Sodium 136   Lipid Panel    Component Value Date/Time   CHOL 163 07/19/2018 0621   TRIG 190 (H) 07/19/2018 0621   HDL 10 (L) 07/19/2018 0621   CHOLHDL 16.3 07/19/2018 0621   VLDL 38 07/19/2018 0621   LDLCALC 115 (H) 07/19/2018 0621    Additional studies/ records that were reviewed today include:  Echo 07/2018 IMPRESSIONS     1. The left ventricle was not well visualized. The cavity size was mildly  dilated. There is mildly increased left ventricular wall thickness. Left  ventricular diastolic Doppler parameters are indeterminate Indeterminent  filling pressures.   2. LV poorly visualized Abnormal septal motion Frequent ectopy during  exam EF ? 45-50%.   3. The right ventricle has normal systolic function. The cavity was  normal. There is no increase in right ventricular wall thickness.   4. Left atrial size was mildly dilated.   5. The mitral valve is degenerative. Mild thickening of the mitral valve  leaflet.   6. The tricuspid valve is normal in structure.   7. The aortic valve is tricuspid Mild thickening of the aortic valve Mild  calcification of the aortic valve.   8. The pulmonic valve was normal in structure.   FINDINGS   Left Ventricle: The left ventricle was not well visualized. The cavity  size was mildly dilated. There is mildly increased left ventricular wall  thickness. Left ventricular diastolic Doppler parameters are indeterminate  Indeterminent filling pressures  Definity contrast agent was given IV to delineate the left ventricular  endocardial borders. LV poorly visualized Abnormal septal motion Frequent  ectopy during exam EF ? 45-50%.  Right Ventricle: The right ventricle has normal systolic function. The  cavity was normal. There is no increase in right ventricular wall  thickness.      Risk Assessment/Calculations:   {Does this patient have ATRIAL  FIBRILLATION?:[phone]}     ASSESSMENT:    No diagnosis found.   PLAN:  In order of problems listed above:  PAF on Eliquis and Toprol  HTN  Lower extremity edema  Tobacco abuse   Shared Decision Making/Informed Consent   {Are you ordering a CV Procedure (e.g. stress test, cath, DCCV, TEE, etc)?   Press F2        :[id number]}    Medication Adjustments/Labs and Tests Ordered: Current medicines are reviewed at length with the patient today.  Concerns regarding medicines are outlined above.  Medication changes, Labs and Tests ordered today are listed in the Patient Instructions below. There are no Patient Instructions on file for  this visit.   Elson Clan, PA-C  05/30/2020 8:12 AM    [facility] [address] [place], [place], [place]  [address] Phone: [phone]; Fax: [phone]

## 2020-06-05 ENCOUNTER — Ambulatory Visit: Payer: Medicare Other | Admitting: Physician Assistant

## 2020-06-11 ENCOUNTER — Ambulatory Visit: Payer: Medicare Other | Admitting: Gastroenterology

## 2020-08-20 NOTE — Progress Notes (Deleted)
Cardiology Office Note    Date:  08/20/2020   ID:  Frank May, DOB May 05, 1961, MRN [id number]   PCP:  Gavin Potters   [facility]  Cardiologist:  Verne Carrow, MD *** Advanced Practice Provider:  No care team member to display Electrophysiologist:  None   [id number]}   No chief complaint on file.   History of Present Illness:  Frank May is a 60 y.o. male with history of PAF on Eliquis and Toprol, HTN, Tobacco Abuse, COPD. Last saw Dr. Clifton James 07/2018 and doing well. Echo 07/2018 LVEF ? 45-50% but lots of ectopy and poor LV visualization during exam.      Past Medical History:  Diagnosis Date  . A-fib (HCC)   . Arthritis    right knee, right hip, right shoulder  . 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 Asthma   . GERD (gastroesophageal reflux disease)   . History of COPD   . History of gout    right knee  . Hypertension    states under control with med., has been on med. > 20 yr.  . 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 CLOSED REDUCTION FOOT DISLOCATION Left   . FOOT ARTHRODESIS Right 10/29/2016   Procedure: Right subtalar arthrodesis;  Surgeon: Toni Arthurs, MD;  Location: [facility];  Service: Orthopedics;  Laterality: Right;  . FOOT HARDWARE REMOVAL Right 2017  . FOOT SURGERY Right    5-6 x  . HARDWARE REMOVAL Right 10/29/2016   Procedure: Right ankle removal of deep implant;  Surgeon: Toni Arthurs, MD;  Location: [facility];  Service: Orthopedics;  Laterality: Right;  . NOSE SURGERY     x 3  . ORIF FOOT FRACTURE Right 1990    Current Medications: No outpatient medications have been marked as taking for the 08/21/20 encounter (Appointment) with Dyann Kief, PA-C.     Allergies:   Patient has no known allergies.   Social History   Socioeconomic History  . Marital status: Married    Spouse  name: Not on file  . Number of children: Not on file  . Years of education: Not on file  . Highest education level: Not on file  Occupational History  . Not on file  Tobacco Use  . Smoking status: Current Every Day Smoker    Packs/day: 1.00    Years: 40.00    Pack years: 40.00    Types: Cigarettes  . Smokeless tobacco: Never Used  Vaping Use  . Vaping Use: Never used  Substance and Sexual Activity  . Alcohol use: Not Currently    Comment: occasionally  . Drug use: No  . Sexual activity: Not on file  Other Topics Concern  . Not on file  Social History Narrative  . Not on file   Social Determinants of Health   Financial Resource Strain: Not on file  Food Insecurity: Not on file  Transportation Needs: Not on file  Physical Activity: Not on file  Stress: Not on file  Social Connections: Not on file     Family History:  The patient's ***family history includes CVA in his mother.   ROS:   Please see the history of present illness.    ROS All other systems reviewed and are negative.   PHYSICAL EXAM:   VS:  There were no vitals taken for this visit.  Physical Exam  GEN: Well nourished, well developed, in no acute distress  HEENT: normal  Neck: no JVD, carotid bruits, or masses Cardiac:RRR; no murmurs, rubs, or gallops  Respiratory:  clear to auscultation bilaterally, normal work of breathing GI: soft, nontender, nondistended, + BS Ext: without cyanosis, clubbing, or edema, Good distal pulses bilaterally MS: no deformity or atrophy  Skin: warm and dry, no rash Neuro:  Alert and Oriented x 3, Strength and sensation are intact Psych: euthymic mood, full affect  Wt Readings from Last 3 Encounters:  12/17/19 (!) 334 lb (151.5 kg)  06/27/19 (!) 319 lb (144.7 kg)  08/10/18 (!) 309 lb 12.8 oz (140.5 kg)      Studies/Labs Reviewed:   EKG:  EKG is*** ordered today.  The ekg ordered today demonstrates ***  Recent Labs: 12/17/2019: B Natriuretic Peptide 82.3; BUN 12;  Creatinine, Ser 0.92; Hemoglobin 12.4; Platelets 168; Potassium 4.3; Sodium 136   Lipid Panel    Component Value Date/Time   CHOL 163 07/19/2018 0621   TRIG 190 (H) 07/19/2018 0621   HDL 10 (L) 07/19/2018 0621   CHOLHDL 16.3 07/19/2018 0621   VLDL 38 07/19/2018 0621   LDLCALC 115 (H) 07/19/2018 0621    Additional studies/ records that were reviewed today include:  Echo 07/2018 IMPRESSIONS     1. The left ventricle was not well visualized. The cavity size was mildly  dilated. There is mildly increased left ventricular wall thickness. Left  ventricular diastolic Doppler parameters are indeterminate Indeterminent  filling pressures.   2. LV poorly visualized Abnormal septal motion Frequent ectopy during  exam EF ? 45-50%.   3. The right ventricle has normal systolic function. The cavity was  normal. There is no increase in right ventricular wall thickness.   4. Left atrial size was mildly dilated.   5. The mitral valve is degenerative. Mild thickening of the mitral valve  leaflet.   6. The tricuspid valve is normal in structure.   7. The aortic valve is tricuspid Mild thickening of the aortic valve Mild  calcification of the aortic valve.   8. The pulmonic valve was normal in structure.   FINDINGS   Left Ventricle: The left ventricle was not well visualized. The cavity  size was mildly dilated. There is mildly increased left ventricular wall  thickness. Left ventricular diastolic Doppler parameters are indeterminate  Indeterminent filling pressures  Definity contrast agent was given IV to delineate the left ventricular  endocardial borders. LV poorly visualized Abnormal septal motion Frequent  ectopy during exam EF ? 45-50%.  Right Ventricle: The right ventricle has normal systolic function. The  cavity was normal. There is no increase in right ventricular wall  thickness.      Risk Assessment/Calculations:   {Does this patient have ATRIAL  FIBRILLATION?[phone]}     ASSESSMENT:    No diagnosis found.   PLAN:  In order of problems listed above:  PAF on Eliquis and Toprol  HTN  Lower extremity edema  Tobacco abuse   Shared Decision Making/Informed Consent   {Are you ordering a CV Procedure (e.g. stress test, cath, DCCV, TEE, etc)?   Press F2        :[id number]}    Medication Adjustments/Labs and Tests Ordered: Current medicines are reviewed at length with the patient today.  Concerns regarding medicines are outlined above.  Medication changes, Labs and Tests ordered today are listed in the Patient Instructions below. There are no Patient Instructions on file for this visit.  Elson Clan, PA-C  08/20/2020 11:50 AM    [facility] [address] [place], [place], [place]  [address] Phone: [phone]; Fax: [phone]

## 2020-08-21 ENCOUNTER — Ambulatory Visit: Payer: Medicare Other | Admitting: Physician Assistant

## 2020-08-26 ENCOUNTER — Encounter: Payer: Self-pay | Admitting: General Practice

## 2020-10-21 ENCOUNTER — Other Ambulatory Visit: Payer: Self-pay | Admitting: Nurse Practitioner

## 2020-10-21 DIAGNOSIS — F17209 Nicotine dependence, unspecified, with unspecified nicotine-induced disorders: Secondary | ICD-10-CM

## 2020-10-21 DIAGNOSIS — Z87891 Personal history of nicotine dependence: Secondary | ICD-10-CM

## 2020-11-01 ENCOUNTER — Inpatient Hospital Stay: Admission: RE | Admit: 2020-11-01 | Payer: Medicare Other | Source: Ambulatory Visit

## 2020-11-08 ENCOUNTER — Ambulatory Visit: Payer: Medicare Other

## 2021-05-07 ENCOUNTER — Ambulatory Visit (INDEPENDENT_AMBULATORY_CARE_PROVIDER_SITE_OTHER): Payer: Medicare Other | Admitting: Pulmonary Disease

## 2021-05-07 ENCOUNTER — Encounter: Payer: Self-pay | Admitting: Pulmonary Disease

## 2021-05-07 ENCOUNTER — Other Ambulatory Visit: Payer: Self-pay

## 2021-05-07 VITALS — BP 128/84 | HR 71 | Temp 97.7°F | Ht 69.0 in | Wt 350.6 lb

## 2021-05-07 DIAGNOSIS — J449 Chronic obstructive pulmonary disease, unspecified: Secondary | ICD-10-CM

## 2021-05-07 DIAGNOSIS — R918 Other nonspecific abnormal finding of lung field: Secondary | ICD-10-CM | POA: Diagnosis not present

## 2021-05-07 DIAGNOSIS — F17200 Nicotine dependence, unspecified, uncomplicated: Secondary | ICD-10-CM

## 2021-05-07 DIAGNOSIS — R911 Solitary pulmonary nodule: Secondary | ICD-10-CM

## 2021-05-07 MED ORDER — TRELEGY ELLIPTA 200-62.5-25 MCG/ACT IN AEPB
1.0000 | INHALATION_SPRAY | Freq: Every day | RESPIRATORY_TRACT | 0 refills | Status: DC
Start: 2021-05-07 — End: 2024-03-15

## 2021-05-07 MED ORDER — TRELEGY ELLIPTA 100-62.5-25 MCG/ACT IN AEPB: 1.0000 | INHALATION_SPRAY | Freq: Every day | RESPIRATORY_TRACT | 3 refills | Status: AC

## 2021-05-07 NOTE — Progress Notes (Signed)
Synopsis: Referred in Dec 2022  for lung nodule by Domenick Bookbinder, PA-C  Subjective:   PATIENT ID: Frank May GENDER: male DOB: [birth year], MRN: [id number]  Chief Complaint  Patient presents with   Consult    Patient is here to talk about spot on his lung    PMH afib, asthma, gerd, htn. He is from [place]. 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 Current smoker since age 60, 0.5 pdd per day, at his max 1.5ppd, started slowing down about 3 years ago and he quit drinking, 30 years of drinking alcohol.  Patient had a lung cancer screening CT completed at [facility].Patient's lung cancer screening CT was completed on 03/25/2021.  This was read as a lung RADS 4A with a possible left lower lobe endobronchial opacity, mass not excluded.  Possibly mucous plugging.  Patient overall has not been on any maintenance inhalers for a likely diagnosis of COPD.  He currently uses albuterol as needed.  He does feel like it makes his breathing better when he uses his albuterol.  He does have transportation issues and has a card today to sign to help with transportation.  Overall from a breathing standpoint he feels at his baseline.  But he does have cough and sputum production.  As well as dyspnea on exertion.   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 > 20 yr.   Retained orthopedic hardware 09/2016   right foot     Family History  Problem Relation Age of Onset   CVA Mother      Past Surgical History:  Procedure Laterality Date   CLOSED REDUCTION FOOT DISLOCATION Left    FOOT ARTHRODESIS Right 10/29/2016   Procedure: Right subtalar arthrodesis;  Surgeon: Toni Arthurs, MD;  Location: [facility];  Service: Orthopedics;  Laterality: Right;   FOOT HARDWARE REMOVAL Right 2017   FOOT SURGERY Right    5-6 x   HARDWARE REMOVAL Right 10/29/2016   Procedure: Right ankle removal of deep implant;  Surgeon: Toni Arthurs, MD;  Location: [facility];  Service: Orthopedics;  Laterality: Right;   NOSE SURGERY     x 3   ORIF FOOT FRACTURE Right 1990    Social History   Socioeconomic History   Marital status: Married    Spouse name: Not on file   Number of children: Not on file   Years of education: Not on file   Highest education level: Not on file  Occupational History   Not on file  Tobacco Use   Smoking status: Every Day    Packs/day: 1.00    Years: 40.00    Pack years: 40.00    Types: Cigarettes   Smokeless tobacco: Never  Vaping Use   Vaping Use: Never used  Substance and Sexual Activity   Alcohol use: Not Currently    Comment: occasionally   Drug use: No   Sexual activity: Not on file  Other Topics Concern   Not on file  Social History Narrative   Not on file  Social Determinants of Health   Financial Resource Strain: Not on file  Food Insecurity: Not on file  Transportation Needs: Not on file  Physical Activity: Not on file  Stress: Not on file  Social Connections: Not on file  Intimate Partner Violence: Not on file     No Known Allergies   Outpatient Medications Prior to Visit  Medication Sig Dispense Refill   albuterol (PROVENTIL HFA;VENTOLIN HFA) 108 (90 Base) MCG/ACT inhaler Inhale 2 puffs into the lungs every 6 (six) hours as needed for wheezing or shortness of breath.      albuterol (PROVENTIL) (2.5 MG/3ML) 0.083% nebulizer solution Take 2.5 mg by nebulization 3 (three) times daily as needed for wheezing or shortness of breath.     allopurinol (ZYLOPRIM) 100 MG tablet Take 100 mg by mouth daily.     apixaban (ELIQUIS) 5 MG TABS tablet Take 1 tablet (5 mg total) by mouth 2 (two) times daily. 60  tablet 0   aspirin EC 81 MG tablet Take 81 mg by mouth daily.     cetirizine (ZYRTEC) 10 MG tablet Take 10 mg by mouth daily.     diclofenac (VOLTAREN) 75 MG EC tablet Take 75 mg by mouth 2 (two) times daily.     docusate sodium (COLACE) 100 MG capsule Take 1 capsule (100 mg total) by mouth 2 (two) times daily. While taking narcotic pain medicine. 30 capsule 0   DOXYCYCLINE PO Take 100 mg by mouth.     HYDROcodone-acetaminophen (NORCO) 10-325 MG tablet Take 1 tablet by mouth every 6 (six) hours as needed (for pain).     lisinopril (PRINIVIL,ZESTRIL) 10 MG tablet Take 10 mg by mouth daily.      loperamide (IMODIUM) 2 MG capsule Take 2 mg by mouth 4 (four) times daily as needed for diarrhea or loose stools.     metoprolol tartrate (LOPRESSOR) 25 MG tablet Take 1 tablet (25 mg total) by mouth 2 (two) times daily. 60 tablet 11   naloxone (NARCAN) nasal spray 4 mg/0.1 mL Place 1 spray into the nose daily as needed.     omeprazole (PRILOSEC) 20 MG capsule Take 20 mg by mouth daily.     pregabalin (LYRICA) 75 MG capsule Take 75 mg by mouth 3 (three) times daily.     ranitidine (ZANTAC) 150 MG tablet Take 150 mg by mouth daily.     senna (SENOKOT) 8.6 MG TABS tablet Take 2 tablets (17.2 mg total) by mouth 2 (two) times daily. 30 each 0   tadalafil (CIALIS) 10 MG tablet Take 10 mg by mouth daily as needed for erectile dysfunction.     budesonide-formoterol (SYMBICORT) 160-4.5 MCG/ACT inhaler Inhale 2 puffs into the lungs 2 (two) times daily.     No facility-administered medications prior to visit.    Review of Systems  Constitutional:  Negative for chills, fever, malaise/fatigue and weight loss.  HENT:  Negative for hearing loss, sore throat and tinnitus.   Eyes:  Negative for blurred vision and double vision.  Respiratory:  Positive for cough and shortness of breath. Negative for hemoptysis, sputum production, wheezing and stridor.   Cardiovascular:  Negative for chest pain, palpitations,  orthopnea, leg swelling and PND.  Gastrointestinal:  Negative for abdominal pain, constipation, diarrhea, heartburn, nausea and vomiting.  Genitourinary:  Negative for dysuria, hematuria and urgency.  Musculoskeletal:  Negative for joint pain and myalgias.  Skin:  Negative for itching and rash.  Neurological:  Negative for dizziness, tingling, weakness and headaches.  Endo/Heme/Allergies:  Negative for environmental allergies. Does not bruise/bleed easily.  Psychiatric/Behavioral:  Negative for depression. The patient is not nervous/anxious and does not have insomnia.   All other systems reviewed and are negative.   Objective:  Physical Exam Vitals reviewed.  Constitutional:      General: He is not in acute distress.    Appearance: He is well-developed.  HENT:     Head: Normocephalic and atraumatic.  Eyes:     General: No scleral icterus.    Conjunctiva/sclera: Conjunctivae normal.     Pupils: Pupils are equal, round, and reactive to light.  Neck:     Vascular: No JVD.     Trachea: No tracheal deviation.  Cardiovascular:     Rate and Rhythm: Normal rate and regular rhythm.     Heart sounds: Normal heart sounds. No murmur heard. Pulmonary:     Effort: Pulmonary effort is normal. No tachypnea, accessory muscle usage or respiratory distress.     Breath sounds: No stridor. No wheezing, rhonchi or rales.  Abdominal:     General: Bowel sounds are normal. There is no distension.     Tenderness: There is no abdominal tenderness.     Comments: Obese  Musculoskeletal:        General: No tenderness.     Cervical back: Neck supple.  Lymphadenopathy:     Cervical: No cervical adenopathy.  Skin:    General: Skin is warm and dry.     Capillary Refill: Capillary refill takes less than 2 seconds.     Findings: No rash.  Neurological:     Mental Status: He is alert and oriented to person, place, and time.  Psychiatric:        Behavior: Behavior normal.     Vitals:   05/07/21 0916   BP: 128/84  Pulse: 71  Temp: 97.7 F (36.5 C)  TempSrc: Oral  SpO2: 96%  Weight: (!) 350 lb 9.6 oz (159 kg)  Height: 5\' 9"  (1.753 m)   96% on RA BMI Readings from Last 3 Encounters:  05/07/21 51.77 kg/m  12/17/19 47.92 kg/m  06/27/19 45.77 kg/m   Wt Readings from Last 3 Encounters:  05/07/21 (!) 350 lb 9.6 oz (159 kg)  12/17/19 (!) 334 lb (151.5 kg)  06/27/19 (!) 319 lb (144.7 kg)     CBC    Component Value Date/Time   WBC 4.7 12/17/2019 0805   RBC 5.30 12/17/2019 0805   HGB 12.4 (L) 12/17/2019 0805   HCT 38.9 (L) 12/17/2019 0805   PLT 168 12/17/2019 0805   MCV 73.4 (L) 12/17/2019 0805   MCH 23.4 (L) 12/17/2019 0805   MCHC 31.9 12/17/2019 0805   RDW 16.7 (H) 12/17/2019 0805   LYMPHSABS 1.4 07/18/2018 1047   MONOABS 0.7 07/18/2018 1047   EOSABS 0.0 07/18/2018 1047   BASOSABS 0.0 07/18/2018 1047    Chest Imaging: October 2022 CT chest: Lung cancer screening CT Lung RADS 4A, unable to see CT images today in the office. Was able to review CT report from [place].  Left lower lobe endobronchial opacity. The patient's images have been independently reviewed by me.    Pulmonary Functions Testing Results: No flowsheet data found.  FeNO:   Pathology:   Echocardiogram:   Heart Catheterization:     Assessment & Plan:     ICD-10-CM   1. Lung nodule  R91.1 NM PET Image Initial (PI) Skull Base To Thigh (F-18 FDG)    2. Chronic obstructive pulmonary disease, unspecified  COPD type (HCC)  J44.9     3. Endobronchial mass  R91.8     4. Current smoker  F17.200     5. Abnormal CT lung screening  R91.8       Discussion:  This is a 60 year old gentleman, abnormal lung cancer screening CT longstanding history of smoking, current smoker abnormal lung cancer screening CT with a left lower lobe endobronchial lesion, possible mucous plugging versus mass not excluded.  Plan: We discussed neck steps regarding abnormal CT imaging. It has been a few weeks since his  lung cancer screening CT I think it is probably best that we repeat his images and at the same time just go ahead and get a nuclear medicine PET scan. If the lesion within the lower lobe is persistent I explained that he would likely need bronchoscopy for [place] to take a look inside of his airway to determine etiology. Patient is agreeable to this plan. As for his likely underlying COPD I think he probably benefit from being on a maintenance inhaler. We will give him samples of Trelegy today. We will see how he does on the new inhaler and give him a prescription for this. We will follow-up in just a few weeks to see how he is doing with his new inhaler as well as after his nuclear medicine pet imaging.  Pending upon the PET results we will come up with next best steps regarding procedure if needed.  We appreciate the consultation    Current Outpatient Medications:    albuterol (PROVENTIL HFA;VENTOLIN HFA) 108 (90 Base) MCG/ACT inhaler, Inhale 2 puffs into the lungs every 6 (six) hours as needed for wheezing or shortness of breath. , Disp: , Rfl:    albuterol (PROVENTIL) (2.5 MG/3ML) 0.083% nebulizer solution, Take 2.5 mg by nebulization 3 (three) times daily as needed for wheezing or shortness of breath., Disp: , Rfl:    allopurinol (ZYLOPRIM) 100 MG tablet, Take 100 mg by mouth daily., Disp: , Rfl:    apixaban (ELIQUIS) 5 MG TABS tablet, Take 1 tablet (5 mg total) by mouth 2 (two) times daily., Disp: 60 tablet, Rfl: 0   aspirin EC 81 MG tablet, Take 81 mg by mouth daily., Disp: , Rfl:    cetirizine (ZYRTEC) 10 MG tablet, Take 10 mg by mouth daily., Disp: , Rfl:    diclofenac (VOLTAREN) 75 MG EC tablet, Take 75 mg by mouth 2 (two) times daily., Disp: , Rfl:    docusate sodium (COLACE) 100 MG capsule, Take 1 capsule (100 mg total) by mouth 2 (two) times daily. While taking narcotic pain medicine., Disp: 30 capsule, Rfl: 0   DOXYCYCLINE PO, Take 100 mg by mouth., Disp: , Rfl:     HYDROcodone-acetaminophen (NORCO) 10-325 MG tablet, Take 1 tablet by mouth every 6 (six) hours as needed (for pain)., Disp: , Rfl:    lisinopril (PRINIVIL,ZESTRIL) 10 MG tablet, Take 10 mg by mouth daily. , Disp: , Rfl:    loperamide (IMODIUM) 2 MG capsule, Take 2 mg by mouth 4 (four) times daily as needed for diarrhea or loose stools., Disp: , Rfl:    metoprolol tartrate (LOPRESSOR) 25 MG tablet, Take 1 tablet (25 mg total) by mouth 2 (two) times daily., Disp: 60 tablet, Rfl: 11   naloxone (NARCAN) nasal spray 4 mg/0.1 mL, Place 1 spray into the nose daily as needed., Disp: , Rfl:    omeprazole (PRILOSEC) 20 MG capsule, Take 20 mg by mouth daily., Disp: , Rfl:  pregabalin (LYRICA) 75 MG capsule, Take 75 mg by mouth 3 (three) times daily., Disp: , Rfl:    ranitidine (ZANTAC) 150 MG tablet, Take 150 mg by mouth daily., Disp: , Rfl:    senna (SENOKOT) 8.6 MG TABS tablet, Take 2 tablets (17.2 mg total) by mouth 2 (two) times daily., Disp: 30 each, Rfl: 0   tadalafil (CIALIS) 10 MG tablet, Take 10 mg by mouth daily as needed for erectile dysfunction., Disp: , Rfl:    budesonide-formoterol (SYMBICORT) 160-4.5 MCG/ACT inhaler, Inhale 2 puffs into the lungs 2 (two) times daily., Disp: , Rfl:    Garner Nash, DO [facility] 05/07/2021 9:54 AM

## 2021-05-07 NOTE — Patient Instructions (Addendum)
Thank you for visiting Dr. Tonia Brooms at [facility]. Today we recommend the following:  Orders Placed This Encounter  Procedures   NM PET Image Initial (PI) Skull Base To Thigh (F-18 FDG)   Trelegy 100 samples and new prescription Albuterol inhaler refills   Return in about 3 weeks (around 05/28/2021) for with APP or Dr. Tonia Brooms.    Please do your part to reduce the spread of COVID-19.

## 2021-05-16 ENCOUNTER — Other Ambulatory Visit: Payer: Self-pay

## 2021-05-16 ENCOUNTER — Ambulatory Visit (HOSPITAL_COMMUNITY)
Admission: RE | Admit: 2021-05-16 | Discharge: 2021-05-16 | Disposition: A | Discharge status: Home / Self Care | Payer: Medicare Other | Source: Ambulatory Visit | Attending: Pulmonary Disease | Admitting: Pulmonary Disease

## 2021-05-16 DIAGNOSIS — R911 Solitary pulmonary nodule: Secondary | ICD-10-CM | POA: Insufficient documentation

## 2021-05-16 LAB — GLUCOSE, CAPILLARY: Glucose-Capillary: 80 mg/dL (ref 70–99)

## 2021-05-16 MED ORDER — FLUDEOXYGLUCOSE F - 18 (FDG) INJECTION
15.9900 | Freq: Once | INTRAVENOUS | Status: AC
  Administered 2021-05-16: 15.99 via INTRAVENOUS

## 2021-05-30 ENCOUNTER — Encounter: Payer: Self-pay | Admitting: Pulmonary Disease

## 2021-05-30 ENCOUNTER — Ambulatory Visit (INDEPENDENT_AMBULATORY_CARE_PROVIDER_SITE_OTHER): Payer: Medicare Other | Admitting: Pulmonary Disease

## 2021-05-30 ENCOUNTER — Other Ambulatory Visit: Payer: Self-pay

## 2021-05-30 VITALS — BP 128/78 | HR 59 | Temp 97.7°F | Ht 69.0 in | Wt 348.0 lb

## 2021-05-30 DIAGNOSIS — Z720 Tobacco use: Secondary | ICD-10-CM

## 2021-05-30 DIAGNOSIS — R918 Other nonspecific abnormal finding of lung field: Secondary | ICD-10-CM | POA: Diagnosis not present

## 2021-05-30 DIAGNOSIS — F17210 Nicotine dependence, cigarettes, uncomplicated: Secondary | ICD-10-CM | POA: Diagnosis not present

## 2021-05-30 DIAGNOSIS — J449 Chronic obstructive pulmonary disease, unspecified: Secondary | ICD-10-CM | POA: Diagnosis not present

## 2021-05-30 DIAGNOSIS — Z716 Tobacco abuse counseling: Secondary | ICD-10-CM | POA: Diagnosis not present

## 2021-05-30 MED ORDER — TRELEGY ELLIPTA 100-62.5-25 MCG/ACT IN AEPB
1.0000 | INHALATION_SPRAY | Freq: Every day | RESPIRATORY_TRACT | 3 refills | Status: DC
Start: 2021-05-30 — End: 2022-10-18

## 2021-05-30 MED ORDER — ALBUTEROL SULFATE HFA 108 (90 BASE) MCG/ACT IN AERS: 2.0000 | INHALATION_SPRAY | Freq: Four times a day (QID) | RESPIRATORY_TRACT | 2 refills | Status: DC | PRN

## 2021-05-30 MED ORDER — TRELEGY ELLIPTA 200-62.5-25 MCG/ACT IN AEPB: 1.0000 | INHALATION_SPRAY | Freq: Every day | RESPIRATORY_TRACT | 0 refills | Status: DC

## 2021-05-30 NOTE — Progress Notes (Signed)
Smoking Cessation Counseling:   The patients current tobacco use: down to 0.5ppd, max 2 ppd  The patient was advised to quit and impact of smoking on their health.  I assessed the patient's willingness to attempt to quit. I provided methods and skills for cessation. We reviewed medication management of smoking session drugs if appropriate. Resources to help quit smoking were provided. A smoking cessation quit date was set: March 1st! Follow-up was arranged in our clinic.  The amount of time spent counseling patient was 4 mins    [name], DO [facility] 05/30/2021 9:37 AM

## 2021-05-30 NOTE — Patient Instructions (Addendum)
Thank you for visiting Dr. Tonia Brooms at [facility]. Today we recommend the following:  Orders Placed This Encounter  Procedures   Pulmonary Function Test   Trelegy samples today, new prescription  Albuterol new prescription   Return in about 3 months (around 08/28/2021) for with APP or Dr. Tonia Brooms.    Please do your part to reduce the spread of COVID-19.   You must quit smoking or vaping. This is the single most important thing that you can do to improve your lung health.   S = Set a quit date. T = Tell family, friends, and the people around you that you plan to quit. A = Anticipate or plan ahead for the tough times you'll face while quitting. R = Remove cigarettes and other tobacco products from your home, car, and work T = Talk to [place] about getting help to quit  If you need help feel free to reach out to our office, [facility] Smoking Cessation Class: [phone], call 1-800-QUIT-NOW, or visit [email or web address].

## 2021-05-30 NOTE — Progress Notes (Signed)
Synopsis: Referred in Dec 2022  for lung nodule by Harlene Salts, PA-C  Subjective:   PATIENT ID: Frank May GENDER: male DOB: 1960-08-26, MRN: [id number]  Chief Complaint  Patient presents with   Follow-up    Follow up. Patient has no complaints.     PMH afib, asthma, gerd, htn. He is from [place]. Worked for [employer]. He had asbesto exposure in the schools working in [place]. Current smoker since age 60, 0.5 pdd per day, at his max 1.5ppd, started slowing down about 3 years ago and he quit drinking, 30 years of drinking alcohol.  Patient had a lung cancer screening CT completed at [place].Patient's lung cancer screening CT was completed on 03/25/2021.  This was read as a lung RADS 4A with a possible left lower lobe endobronchial opacity, mass not excluded.  Possibly mucous plugging.  Patient overall has not been on any maintenance inhalers for a likely diagnosis of COPD.  He currently uses albuterol as needed.  He does feel like it makes his breathing better when he uses his albuterol.  He does have transportation issues and has a card today to sign to help with transportation.  Overall from a breathing standpoint he feels at his baseline.  But he does have cough and sputum production.  As well as dyspnea on exertion.  OV 05/30/2021: This is a 60 year old gentleman here for follow-up after nuclear medicine pet imaging.  He initially had a lung RADS 4 lung cancer screening CT with a left lower lobe endobronchial opacity.  CT scan on the PET was reviewed with no concern for malignancy.  I reviewed his PET scan results today in the office.  He is working on quitting smoking.  Please see separate documentation regarding smoking cessation.  He is down to 10 cigarettes a day which is a improvement in comparison to his 2 packs/day in the past.  From a respiratory standpoint he is breathing much better on Trelegy.  He needs refills of this medication.  He feels like his shortness of  breath and his ability to walk and exert himself is much better than before.   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 > 20 yr.   Retained orthopedic hardware 09/2016   right foot     Family History  Problem Relation Age of Onset   CVA Mother      Past Surgical History:  Procedure Laterality Date   CLOSED REDUCTION FOOT DISLOCATION Left    FOOT ARTHRODESIS Right 10/29/2016   Procedure: Right subtalar arthrodesis;  Surgeon: Wylene Simmer, MD;  Location: [place];  Service: Orthopedics;  Laterality: Right;   FOOT HARDWARE REMOVAL Right 2017   FOOT SURGERY Right    5-6 x   HARDWARE REMOVAL Right 10/29/2016   Procedure: Right ankle removal of deep implant;  Surgeon: Wylene Simmer, MD;  Location: [facility];  Service: Orthopedics;  Laterality: Right;   NOSE SURGERY     x 3   ORIF FOOT FRACTURE Right 1990    Social History   Socioeconomic History   Marital  status: Married    Spouse name: Not on file   Number of children: Not on file   Years of education: Not on file   Highest education level: Not on file  Occupational History   Not on file  Tobacco Use   Smoking status: Every Day    Packs/day: 1.00    Years: 40.00    Pack years: 40.00    Types: Cigarettes   Smokeless tobacco: Never  Vaping Use   Vaping Use: Never used  Substance and Sexual Activity   Alcohol use: Not Currently    Comment: occasionally   Drug use: No   Sexual activity: Not on file  Other Topics Concern   Not on file  Social History Narrative   Not on file   Social Determinants of Health   Financial Resource Strain: Not on file  Food Insecurity: Not on file  Transportation Needs: Not  on file  Physical Activity: Not on file  Stress: Not on file  Social Connections: Not on file  Intimate Partner Violence: Not on file     No Known Allergies   Outpatient Medications Prior to Visit  Medication Sig Dispense Refill   albuterol (PROVENTIL HFA;VENTOLIN HFA) 108 (90 Base) MCG/ACT inhaler Inhale 2 puffs into the lungs every 6 (six) hours as needed for wheezing or shortness of breath.      albuterol (PROVENTIL) (2.5 MG/3ML) 0.083% nebulizer solution Take 2.5 mg by nebulization 3 (three) times daily as needed for wheezing or shortness of breath.     allopurinol (ZYLOPRIM) 100 MG tablet Take 100 mg by mouth daily.     apixaban (ELIQUIS) 5 MG TABS tablet Take 1 tablet (5 mg total) by mouth 2 (two) times daily. 60 tablet 0   aspirin EC 81 MG tablet Take 81 mg by mouth daily.     cetirizine (ZYRTEC) 10 MG tablet Take 10 mg by mouth daily.     diclofenac (VOLTAREN) 75 MG EC tablet Take 75 mg by mouth 2 (two) times daily.     docusate sodium (COLACE) 100 MG capsule Take 1 capsule (100 mg total) by mouth 2 (two) times daily. While taking narcotic pain medicine. 30 capsule 0   DOXYCYCLINE PO Take 100 mg by mouth.     Fluticasone-Umeclidin-Vilant (TRELEGY ELLIPTA) 100-62.5-25 MCG/ACT AEPB Inhale 1 puff into the lungs daily. 60 each 3   Fluticasone-Umeclidin-Vilant (TRELEGY ELLIPTA) 200-62.5-25 MCG/ACT AEPB Inhale 1 puff into the lungs daily. 60 each 0   HYDROcodone-acetaminophen (NORCO) 10-325 MG tablet Take 1 tablet by mouth every 6 (six) hours as needed (for pain).     lisinopril (PRINIVIL,ZESTRIL) 10 MG tablet Take 10 mg by mouth daily.      loperamide (IMODIUM) 2 MG capsule Take 2 mg by mouth 4 (four) times daily as needed for diarrhea or loose stools.     metoprolol tartrate (LOPRESSOR) 25 MG tablet Take 1 tablet (25 mg total) by mouth 2 (two) times daily. 60 tablet 11   naloxone (NARCAN) nasal spray 4 mg/0.1 mL Place 1 spray into the nose daily as needed.     omeprazole (PRILOSEC) 20  MG capsule Take 20 mg by mouth daily.     pregabalin (LYRICA) 75 MG capsule Take 75 mg by mouth 3 (three) times daily.     ranitidine (ZANTAC) 150 MG tablet Take 150 mg by mouth daily.     senna (SENOKOT) 8.6 MG TABS tablet Take 2 tablets (17.2 mg total) by mouth 2 (two) times daily. [place]  each 0   tadalafil (CIALIS) 10 MG tablet Take 10 mg by mouth daily as needed for erectile dysfunction.     budesonide-formoterol (SYMBICORT) 160-4.5 MCG/ACT inhaler Inhale 2 puffs into the lungs 2 (two) times daily.     No facility-administered medications prior to visit.    Review of Systems  Constitutional:  Negative for chills, fever, malaise/fatigue and weight loss.  HENT:  Negative for hearing loss, sore throat and tinnitus.   Eyes:  Negative for blurred vision and double vision.  Respiratory:  Positive for cough and shortness of breath. Negative for hemoptysis, sputum production, wheezing and stridor.   Cardiovascular:  Negative for chest pain, palpitations, orthopnea, leg swelling and PND.  Gastrointestinal:  Negative for abdominal pain, constipation, diarrhea, heartburn, nausea and vomiting.  Genitourinary:  Negative for dysuria, hematuria and urgency.  Musculoskeletal:  Negative for joint pain and myalgias.  Skin:  Negative for itching and rash.  Neurological:  Negative for dizziness, tingling, weakness and headaches.  Endo/Heme/Allergies:  Negative for environmental allergies. Does not bruise/bleed easily.  Psychiatric/Behavioral:  Negative for depression. The patient is not nervous/anxious and does not have insomnia.   All other systems reviewed and are negative.   Objective:  Physical Exam Vitals reviewed.  Constitutional:      General: He is not in acute distress.    Appearance: He is well-developed. He is obese.  HENT:     Head: Normocephalic and atraumatic.  Eyes:     General: No scleral icterus.    Conjunctiva/sclera: Conjunctivae normal.     Pupils: Pupils are equal, round, and  reactive to light.  Neck:     Vascular: No JVD.     Trachea: No tracheal deviation.  Cardiovascular:     Rate and Rhythm: Normal rate and regular rhythm.     Heart sounds: Normal heart sounds. No murmur heard. Pulmonary:     Effort: Pulmonary effort is normal. No tachypnea, accessory muscle usage or respiratory distress.     Breath sounds: No stridor. No wheezing, rhonchi or rales.  Abdominal:     General: There is no distension.     Palpations: Abdomen is soft.     Tenderness: There is no abdominal tenderness.  Musculoskeletal:        General: No tenderness.     Cervical back: Neck supple.     Right lower leg: Edema present.     Left lower leg: Edema present.     Comments: Trace bilateral lower extremity edema.  Lymphadenopathy:     Cervical: No cervical adenopathy.  Skin:    General: Skin is warm and dry.     Capillary Refill: Capillary refill takes less than 2 seconds.     Findings: No rash.  Neurological:     Mental Status: He is alert and oriented to person, place, and time.  Psychiatric:        Behavior: Behavior normal.     Vitals:   05/30/21 0905  BP: 128/78  Pulse: (!) 59  Temp: 97.7 F (36.5 C)  TempSrc: Oral  SpO2: 98%  Weight: (!) 348 lb (157.9 kg)  Height: 5\' 9"  (1.753 m)   98% on RA BMI Readings from Last 3 Encounters:  05/30/21 51.39 kg/m  05/07/21 51.77 kg/m  12/17/19 47.92 kg/m   Wt Readings from Last 3 Encounters:  05/30/21 (!) 348 lb (157.9 kg)  05/07/21 (!) 350 lb 9.6 oz (159 kg)  12/17/19 (!) 334 lb (151.5 kg)     CBC  Component Value Date/Time   WBC 4.7 12/17/2019 0805   RBC 5.30 12/17/2019 0805   HGB 12.4 (L) 12/17/2019 0805   HCT 38.9 (L) 12/17/2019 0805   PLT 168 12/17/2019 0805   MCV 73.4 (L) 12/17/2019 0805   MCH 23.4 (L) 12/17/2019 0805   MCHC 31.9 12/17/2019 0805   RDW 16.7 (H) 12/17/2019 0805   LYMPHSABS 1.4 07/18/2018 1047   MONOABS 0.7 07/18/2018 1047   EOSABS 0.0 07/18/2018 1047   BASOSABS 0.0 07/18/2018  1047    Chest Imaging: October 2022 CT chest: Lung cancer screening CT Lung RADS 4A, unable to see CT images today in the office. Was able to review CT report from [facility].  Left lower lobe endobronchial opacity. The patient's images have been independently reviewed by me.    05/16/2021 NM PET:  No suspicious lesions for malignancy  Scarring in the bases  The patient's images have been independently reviewed by me.    Pulmonary Functions Testing Results: No flowsheet data found.  FeNO:   Pathology:   Echocardiogram:   Heart Catheterization:     Assessment & Plan:     ICD-10-CM   1. Chronic obstructive pulmonary disease, unspecified COPD type ([place])  J44.9 Pulmonary Function Test    Ambulatory Referral for Lung Cancer Scre    2. Tobacco use  Z72.0 Ambulatory Referral for Lung Cancer Scre    3. Abnormal CT lung screening  R91.8     4. Encounter for smoking cessation counseling  Z71.6       Discussion:  This is a 60 year old gentleman, abnormal lung cancer screening CT longstanding history of smoking, recent PET scan which was clear, no concern for malignancy in the chest.  Plan: Patient counseled on smoking cessation please see separate documentation. Likely has underlying COPD and has responded well to Trelegy. Continue Trelegy with refills today Albuterol as needed. Ok for refills as needed  Amb ref to lung cancer screening to start in December 2023  RTC in 3 months with PFTs    Current Outpatient Medications:    albuterol (PROVENTIL HFA;VENTOLIN HFA) 108 (90 Base) MCG/ACT inhaler, Inhale 2 puffs into the lungs every 6 (six) hours as needed for wheezing or shortness of breath. , Disp: , Rfl:    albuterol (PROVENTIL) (2.5 MG/3ML) 0.083% nebulizer solution, Take 2.5 mg by nebulization 3 (three) times daily as needed for wheezing or shortness of breath., Disp: , Rfl:    allopurinol (ZYLOPRIM) 100 MG tablet, Take 100 mg by mouth daily., Disp: , Rfl:    apixaban  (ELIQUIS) 5 MG TABS tablet, Take 1 tablet (5 mg total) by mouth 2 (two) times daily., Disp: 60 tablet, Rfl: 0   aspirin EC 81 MG tablet, Take 81 mg by mouth daily., Disp: , Rfl:    cetirizine (ZYRTEC) 10 MG tablet, Take 10 mg by mouth daily., Disp: , Rfl:    diclofenac (VOLTAREN) 75 MG EC tablet, Take 75 mg by mouth 2 (two) times daily., Disp: , Rfl:    docusate sodium (COLACE) 100 MG capsule, Take 1 capsule (100 mg total) by mouth 2 (two) times daily. While taking narcotic pain medicine., Disp: 30 capsule, Rfl: 0   DOXYCYCLINE PO, Take 100 mg by mouth., Disp: , Rfl:    Fluticasone-Umeclidin-Vilant (TRELEGY ELLIPTA) 100-62.5-25 MCG/ACT AEPB, Inhale 1 puff into the lungs daily., Disp: 60 each, Rfl: 3   Fluticasone-Umeclidin-Vilant (TRELEGY ELLIPTA) 200-62.5-25 MCG/ACT AEPB, Inhale 1 puff into the lungs daily., Disp: 60 each, Rfl: 0  HYDROcodone-acetaminophen (NORCO) 10-325 MG tablet, Take 1 tablet by mouth every 6 (six) hours as needed (for pain)., Disp: , Rfl:    lisinopril (PRINIVIL,ZESTRIL) 10 MG tablet, Take 10 mg by mouth daily. , Disp: , Rfl:    loperamide (IMODIUM) 2 MG capsule, Take 2 mg by mouth 4 (four) times daily as needed for diarrhea or loose stools., Disp: , Rfl:    metoprolol tartrate (LOPRESSOR) 25 MG tablet, Take 1 tablet (25 mg total) by mouth 2 (two) times daily., Disp: 60 tablet, Rfl: 11   naloxone (NARCAN) nasal spray 4 mg/0.1 mL, Place 1 spray into the nose daily as needed., Disp: , Rfl:    omeprazole (PRILOSEC) 20 MG capsule, Take 20 mg by mouth daily., Disp: , Rfl:    pregabalin (LYRICA) 75 MG capsule, Take 75 mg by mouth 3 (three) times daily., Disp: , Rfl:    ranitidine (ZANTAC) 150 MG tablet, Take 150 mg by mouth daily., Disp: , Rfl:    senna (SENOKOT) 8.6 MG TABS tablet, Take 2 tablets (17.2 mg total) by mouth 2 (two) times daily., Disp: 30 each, Rfl: 0   tadalafil (CIALIS) 10 MG tablet, Take 10 mg by mouth daily as needed for erectile dysfunction., Disp: , Rfl:     budesonide-formoterol (SYMBICORT) 160-4.5 MCG/ACT inhaler, Inhale 2 puffs into the lungs 2 (two) times daily., Disp: , Rfl:    [name], DO [facility] 05/30/2021 9:32 AM

## 2021-06-30 ENCOUNTER — Encounter (HOSPITAL_BASED_OUTPATIENT_CLINIC_OR_DEPARTMENT_OTHER): Payer: Self-pay | Admitting: Urology

## 2021-06-30 ENCOUNTER — Emergency Department (HOSPITAL_BASED_OUTPATIENT_CLINIC_OR_DEPARTMENT_OTHER)
Admission: EM | Admit: 2021-06-30 | Discharge: 2021-06-30 | Disposition: A | Discharge status: Home / Self Care | Payer: Medicare Other | Attending: Emergency Medicine | Admitting: Emergency Medicine

## 2021-06-30 ENCOUNTER — Other Ambulatory Visit: Payer: Self-pay

## 2021-06-30 ENCOUNTER — Emergency Department (HOSPITAL_BASED_OUTPATIENT_CLINIC_OR_DEPARTMENT_OTHER): Payer: Medicare Other | Admitting: Radiology

## 2021-06-30 DIAGNOSIS — I509 Heart failure, unspecified: Secondary | ICD-10-CM | POA: Diagnosis not present

## 2021-06-30 DIAGNOSIS — Z20822 Contact with and (suspected) exposure to covid-19: Secondary | ICD-10-CM | POA: Diagnosis not present

## 2021-06-30 DIAGNOSIS — Z79899 Other long term (current) drug therapy: Secondary | ICD-10-CM | POA: Insufficient documentation

## 2021-06-30 DIAGNOSIS — S2020XA Contusion of thorax, unspecified, initial encounter: Secondary | ICD-10-CM | POA: Diagnosis not present

## 2021-06-30 DIAGNOSIS — Z7982 Long term (current) use of aspirin: Secondary | ICD-10-CM | POA: Diagnosis not present

## 2021-06-30 DIAGNOSIS — W01198A Fall on same level from slipping, tripping and stumbling with subsequent striking against other object, initial encounter: Secondary | ICD-10-CM | POA: Diagnosis not present

## 2021-06-30 DIAGNOSIS — Z7901 Long term (current) use of anticoagulants: Secondary | ICD-10-CM | POA: Insufficient documentation

## 2021-06-30 DIAGNOSIS — J449 Chronic obstructive pulmonary disease, unspecified: Secondary | ICD-10-CM | POA: Insufficient documentation

## 2021-06-30 DIAGNOSIS — S20219A Contusion of unspecified front wall of thorax, initial encounter: Secondary | ICD-10-CM

## 2021-06-30 DIAGNOSIS — R0602 Shortness of breath: Secondary | ICD-10-CM

## 2021-06-30 DIAGNOSIS — R600 Localized edema: Secondary | ICD-10-CM | POA: Insufficient documentation

## 2021-06-30 DIAGNOSIS — S299XXA Unspecified injury of thorax, initial encounter: Secondary | ICD-10-CM | POA: Diagnosis present

## 2021-06-30 LAB — COMPREHENSIVE METABOLIC PANEL
ALT: 25 U/L (ref 0–44)
AST: 25 U/L (ref 15–41)
Albumin: 3.9 g/dL (ref 3.5–5.0)
Alkaline Phosphatase: 36 U/L — ABNORMAL LOW (ref 38–126)
Anion gap: 7 (ref 5–15)
BUN: 15 mg/dL (ref 8–23)
CO2: 25 mmol/L (ref 22–32)
Calcium: 9.2 mg/dL (ref 8.9–10.3)
Chloride: 104 mmol/L (ref 98–111)
Creatinine, Ser: 0.93 mg/dL (ref 0.61–1.24)
GFR, Estimated: >60 mL/min (ref >60)
Glucose, Bld: 97 mg/dL (ref 70–99)
Potassium: 4 mmol/L (ref 3.5–5.1)
Sodium: 136 mmol/L (ref 135–145)
Total Bilirubin: 0.4 mg/dL (ref 0.3–1.2)
Total Protein: 8.5 g/dL — ABNORMAL HIGH (ref 6.5–8.1)

## 2021-06-30 LAB — CBC WITH DIFFERENTIAL/PLATELET
Abs Immature Granulocytes: 0.01 10*3/uL (ref 0.00–0.07)
Basophils Absolute: 0 10*3/uL (ref 0.0–0.1)
Basophils Relative: 1 %
Eosinophils Absolute: 0 10*3/uL (ref 0.0–0.5)
Eosinophils Relative: 0 %
HCT: 38.8 % — ABNORMAL LOW (ref 39.0–52.0)
Hemoglobin: 12.3 g/dL — ABNORMAL LOW (ref 13.0–17.0)
Immature Granulocytes: 0 %
Lymphocytes Relative: 34 %
Lymphs Abs: 1.4 10*3/uL (ref 0.7–4.0)
MCH: 23.6 pg — ABNORMAL LOW (ref 26.0–34.0)
MCHC: 31.7 g/dL (ref 30.0–36.0)
MCV: 74.5 fL — ABNORMAL LOW (ref 80.0–100.0)
Monocytes Absolute: 0.4 10*3/uL (ref 0.1–1.0)
Monocytes Relative: 10 %
Neutro Abs: 2.3 10*3/uL (ref 1.7–7.7)
Neutrophils Relative %: 55 %
Platelets: 167 10*3/uL (ref 150–400)
RBC: 5.21 MIL/uL (ref 4.22–5.81)
RDW: 17.2 % — ABNORMAL HIGH (ref 11.5–15.5)
WBC: 4.2 10*3/uL (ref 4.0–10.5)
nRBC: 0 % (ref 0.0–0.2)

## 2021-06-30 LAB — URINALYSIS, ROUTINE W REFLEX MICROSCOPIC
Bilirubin Urine: NEGATIVE
Glucose, UA: NEGATIVE mg/dL
Hgb urine dipstick: NEGATIVE
Ketones, ur: NEGATIVE mg/dL
Leukocytes,Ua: NEGATIVE
Nitrite: NEGATIVE
Protein, ur: 30 mg/dL — AB
Specific Gravity, Urine: 1.03 (ref 1.005–1.030)
pH: 6 (ref 5.0–8.0)

## 2021-06-30 LAB — RESP PANEL BY RT-PCR (FLU A&B, COVID) ARPGX2
Influenza A by PCR: NEGATIVE
Influenza B by PCR: NEGATIVE
SARS Coronavirus 2 by RT PCR: NEGATIVE

## 2021-06-30 LAB — BRAIN NATRIURETIC PEPTIDE: B Natriuretic Peptide: 106.3 pg/mL — ABNORMAL HIGH (ref 0.0–100.0)

## 2021-06-30 MED ORDER — PREDNISONE 20 MG PO TABS: 40.0000 mg | ORAL_TABLET | Freq: Every day | ORAL | 0 refills | Status: DC

## 2021-06-30 MED ORDER — IPRATROPIUM-ALBUTEROL 0.5-2.5 (3) MG/3ML IN SOLN
3.0000 mL | Freq: Once | RESPIRATORY_TRACT | Status: AC
  Administered 2021-06-30: 3 mL via RESPIRATORY_TRACT
  Filled 2021-06-30: qty 3

## 2021-06-30 NOTE — ED Notes (Signed)
Pt resting with eyes closed, audibly snoring, even rise and fall of chest. Call bell in reach bed in lowest locked position

## 2021-06-30 NOTE — ED Provider Notes (Signed)
[facility]   CSN: [id number] Arrival date & time: 06/30/21  1108     History  Chief Complaint  Patient presents with   Shortness of Breath    Frank May is a 61 y.o. male.  Patient with history of congestive heart failure, COPD, no anticoagulation use --presents to the emergency department for evaluation of shortness of breath.  Patient states that he has had shortness of breath and bilateral rib pain that started after a fall 3 days ago.  Patient states that he was trying to get into his car and was not paying attention and fell straight back onto his back.  He has had pain, especially with deep breathing, since that time.  Rib pain is bilateral and nonfocal.  He denies significant wheezing.  He has had some cough without hemoptysis.  He has baseline lower extremity swelling, not worse than usual.  No anterior abdominal pain or chest pain.  No urinary symptoms.  No fevers, vomiting, diarrhea.  Patient is a smoker.      Home Medications Prior to Admission medications   Medication Sig Start Date End Date Taking? Authorizing Provider  albuterol (PROVENTIL HFA;VENTOLIN HFA) 108 (90 Base) MCG/ACT inhaler Inhale 2 puffs into the lungs every 6 (six) hours as needed for wheezing or shortness of breath.     [provider]  albuterol (PROVENTIL) (2.5 MG/3ML) 0.083% nebulizer solution Take 2.5 mg by nebulization 3 (three) times daily as needed for wheezing or shortness of breath.    [provider]  albuterol (VENTOLIN HFA) 108 (90 Base) MCG/ACT inhaler Inhale 2 puffs into the lungs every 6 (six) hours as needed for wheezing or shortness of breath. 05/30/21   Josephine IgoIcard, Bradley L, DO  allopurinol (ZYLOPRIM) 100 MG tablet Take 100 mg by mouth daily.    [provider]  apixaban (ELIQUIS) 5 MG TABS tablet Take 1 tablet (5 mg total) by mouth 2 (two) times daily. 07/20/18   Swayze, Ava, DO  aspirin EC 81 MG tablet Take 81 mg by  mouth daily.    [provider]  budesonide-formoterol (SYMBICORT) 160-4.5 MCG/ACT inhaler Inhale 2 puffs into the lungs 2 (two) times daily. 07/28/18 07/28/19  [provider]  cetirizine (ZYRTEC) 10 MG tablet Take 10 mg by mouth daily.    [provider]  diclofenac (VOLTAREN) 75 MG EC tablet Take 75 mg by mouth 2 (two) times daily.    [provider]  docusate sodium (COLACE) 100 MG capsule Take 1 capsule (100 mg total) by mouth 2 (two) times daily. While taking narcotic pain medicine. 10/29/16   Jacinta Shoellis, Justin Pike, PA-C  DOXYCYCLINE PO Take 100 mg by mouth.    [provider]  Fluticasone-Umeclidin-Vilant (TRELEGY ELLIPTA) 100-62.5-25 MCG/ACT AEPB Inhale 1 puff into the lungs daily. 05/07/21   Icard, Rachel BoBradley L, DO  Fluticasone-Umeclidin-Vilant (TRELEGY ELLIPTA) 100-62.5-25 MCG/ACT AEPB Inhale 1 puff into the lungs daily. 05/30/21   Josephine IgoIcard, Bradley L, DO  Fluticasone-Umeclidin-Vilant (TRELEGY ELLIPTA) 200-62.5-25 MCG/ACT AEPB Inhale 1 puff into the lungs daily. 05/07/21   Josephine IgoIcard, Bradley L, DO  Fluticasone-Umeclidin-Vilant (TRELEGY ELLIPTA) 200-62.5-25 MCG/ACT AEPB Inhale 1 puff into the lungs daily. 05/30/21   Icard, Rachel BoBradley L, DO  HYDROcodone-acetaminophen (NORCO) 10-325 MG tablet Take 1 tablet by mouth every 6 (six) hours as needed (for pain).    [provider]  lisinopril (PRINIVIL,ZESTRIL) 10 MG tablet Take 10 mg by mouth daily.     [provider]  loperamide (  IMODIUM) 2 MG capsule Take 2 mg by mouth 4 (four) times daily as needed for diarrhea or loose stools.    [provider]  metoprolol tartrate (LOPRESSOR) 25 MG tablet Take 1 tablet (25 mg total) by mouth 2 (two) times daily. 08/10/18   Kathleene HazelMcAlhany, Christopher D, MD  naloxone [facility](NARCAN) nasal spray 4 mg/0.1 mL Place 1 spray into the nose daily as needed.    [provider]  omeprazole (PRILOSEC) 20 MG capsule Take 20 mg by mouth daily. 07/28/18   [provider]  pregabalin (LYRICA) 75 MG capsule Take 75 mg by mouth 3 (three) times daily. 07/11/18   [provider]  ranitidine (ZANTAC) 150 MG tablet Take 150 mg by mouth daily.    [provider]  senna (SENOKOT) 8.6 MG TABS tablet Take 2 tablets (17.2 mg total) by mouth 2 (two) times daily. 10/29/16   Jacinta Shoellis, Justin Pike, PA-C  tadalafil (CIALIS) 10 MG tablet Take 10 mg by mouth daily as needed for erectile dysfunction.    [provider]      Allergies    Patient has no known allergies.    Review of Systems   Review of Systems  Physical Exam Updated Vital Signs BP (!) 146/74 (BP Location: Right Arm)    Pulse 64    Temp 97.8 F (36.6 C)    Resp 18    Ht 5\' 9"  (1.753 m)    Wt (!) 154.2 kg    SpO2 99%    BMI 50.21 kg/m  Physical Exam Vitals and nursing note reviewed.  Constitutional:      Appearance: He is well-developed. He is not diaphoretic.  HENT:     Head: Normocephalic and atraumatic.     Mouth/Throat:     Mouth: Mucous membranes are moist. Mucous membranes are not dry.     Pharynx: No pharyngeal swelling.  Eyes:     Conjunctiva/sclera: Conjunctivae normal.  Neck:     Vascular: Normal carotid pulses. No carotid bruit or JVD.     Trachea: Trachea normal. No tracheal deviation.  Cardiovascular:     Rate and Rhythm: Normal rate and regular rhythm.     Pulses: No decreased pulses.          Radial pulses are 2+ on the right side and 2+ on the left side.     Heart sounds: Normal heart sounds, S1 normal and S2 normal. Heart sounds not distant. No murmur heard. Pulmonary:     Effort: Pulmonary effort is normal. No respiratory distress.     Breath sounds: Examination of the right-lower field reveals rales. Examination of the left-lower field reveals rales. Wheezing (minimal, scattered) and rales present. No decreased breath sounds.  Chest:     Chest wall: No tenderness.  Abdominal:     General: Bowel sounds are normal.     Palpations: Abdomen is soft.      Tenderness: There is no abdominal tenderness. There is no guarding or rebound.  Musculoskeletal:     Cervical back: Normal range of motion and neck supple. No muscular tenderness.     Right lower leg: Edema present.     Left lower leg: Edema present.     Comments: 1+, minimally pitting edema bilateral lower extremities, appear symmetric.  Skin:    General: Skin is warm and dry.     Coloration: Skin is not pale.  Neurological:     Mental Status: He is alert. Mental status is at baseline.  Psychiatric:        Mood and Affect: Mood normal.    ED Results / Procedures / Treatments   Labs (all labs ordered are listed, but only abnormal results are displayed) Labs Reviewed  CBC WITH DIFFERENTIAL/PLATELET - Abnormal; Notable for the following components:      Result Value   Hemoglobin 12.3 (*)    HCT 38.8 (*)    MCV 74.5 (*)    MCH 23.6 (*)    RDW 17.2 (*)    All other components within normal limits  COMPREHENSIVE METABOLIC PANEL - Abnormal; Notable for the following components:   Total Protein 8.5 (*)    Alkaline Phosphatase 36 (*)    All other components within normal limits  BRAIN NATRIURETIC PEPTIDE - Abnormal; Notable for the following components:   B Natriuretic Peptide 106.3 (*)    All other components within normal limits  URINALYSIS, ROUTINE W REFLEX MICROSCOPIC - Abnormal; Notable for the following components:   Protein, ur 30 (*)    All other components within normal limits  RESP PANEL BY RT-PCR (FLU A&B, COVID) [id number]   ED ECG REPORT   Date: 06/30/2021  Rate: 58  Rhythm: sinus bradycardia  QRS Axis: left  Intervals: normal  ST/T Wave abnormalities: normal  Conduction Disutrbances:left anterior fascicular block  Narrative Interpretation:   Old EKG Reviewed: unchanged  I have personally reviewed the EKG tracing and agree with the computerized printout as noted.   Radiology DG Chest 2 View  Result Date: 06/30/2021 CLINICAL DATA:  Shortness of breath,  bilateral rib pain. EXAM: CHEST - 2 VIEW COMPARISON:  PET-CT 05/16/2021.  Chest radiographs 12/17/2019. FINDINGS: Heart size within normal limits. Aortic atherosclerosis. Peribronchial thickening. No appreciable airspace consolidation or pulmonary edema. No evidence of pleural effusion or pneumothorax. No acute bony abnormality identified. IMPRESSION: Peribronchial thickening, a finding which may be seen in the setting of viral infection or reactive airway disease/asthma. No appreciable airspace consolidation. Aortic Atherosclerosis (ICD10-I70.0). Electronically Signed   By: Jackey Loge D.O.   On: 06/30/2021 12:43    Procedures Procedures    Medications Ordered in ED Medications  ipratropium-albuterol (DUONEB) 0.5-2.5 (3) MG/3ML nebulizer solution 3 mL (3 mLs Nebulization Given 06/30/21 1215)    ED Course/ Medical Decision Making/ A&P   Patient seen and examined. History obtained directly from patient and from outpatient clinic notes including pulmonology.  Labs/EKG: Ordered CBC, CMP, BNP, COVID, UA.  Imaging: Ordered chest x-ray.  Medications/Fluids: Ordered: Albuterol and Atrovent.   Most recent vital signs reviewed and are as follows: BP (!) 146/74 (BP Location: Right Arm)    Pulse 64    Temp 97.8 F (36.6 C)    Resp 18    Ht 5\' 9"  (1.753 m)    Wt (!) 154.2 kg    SpO2 99%    BMI 50.21 kg/m   Initial impression: Shortness of breath, possible rib contusion due to fall  12:48 PM personally reviewed and interpreted chest x-ray.  Agree no consolidations, effusions.  Read as findings suggestive of COPD/asthma.   1:50 PM Reassessment performed. Patient appears comfortable.  On exam, lungs are clear now.  He states that the breathing treatment "did wonders" for his breathing.  Continues to have some soreness in his bilateral posterior ribs.  Labs and imaging personally reviewed and interpreted including: CBC with normal white blood cell count, slightly red blood blood cell count; CMP  unremarkable; mildly elevated BNP; negative flu and COVID.  Reviewed additional pertinent lab  work and imaging with patient at bedside including: Chest x-ray, EKG, flu and COVID testing, BNP.  Most current vital signs reviewed and are as follows: BP 127/71 (BP Location: Right Arm)    Pulse 61    Temp 97.8 F (36.6 C)    Resp 16    Ht 5\' 9"  (1.753 m)    Wt (!) 154.2 kg    SpO2 96%    BMI 50.21 kg/m   Plan: Discharge home, patient is comfortable with this plan.    Home treatment: Prescription written for prednisone.  Patient will use his breathing medications as prescribed.  Return and follow-up instructions: Encouraged return to ED with worsening shortness of breath, trouble breathing, fevers or new symptoms. Encouraged patient to follow-up with their provider in 3 days. Patient verbalized understanding and agreed with plan.                              Medical Decision Making Amount and/or Complexity of Data Reviewed Labs: ordered. Radiology: ordered.  Risk Prescription drug management.   Patient presents with posterior and lateral rib pain after a fall with associated shortness of breath.  He has a history of asthma/COPD and heart failure.  Chest x-ray without significant edema, pneumothorax, or broken ribs.  No pneumonia.  Shortness of breath significantly improved in the emergency department with breathing treatment.  He has ambulated and did not drop below 94%.  He is feeling better.  BNP not suggestive of heart failure.  EKG nonischemic and unchanged.  Suspect that he has a mild exacerbation of asthma/COPD and soreness due to his recent fall.  He looks well and is comfortable discharged home.  He seems reliable to return if symptoms do worsen.  We will give burst of prednisone and patient will continue home medications.        Final Clinical Impression(s) / ED Diagnoses Final diagnoses:  Shortness of breath  Contusion of rib, unspecified laterality, initial encounter     Rx / DC Orders ED Discharge Orders          Ordered    predniSONE (DELTASONE) 20 MG tablet  Daily        06/30/21 1348              07/02/21, PA-C 06/30/21 1353    07/02/21, MD 07/02/21 0800

## 2021-06-30 NOTE — ED Triage Notes (Signed)
States Rib pain and shortness of breath, pain with deep breathing, started 3 days ago  States started after fall  States "my pee is brown"  Current pack a day smoker

## 2021-06-30 NOTE — ED Notes (Signed)
Dc instructions and scripts reviewed with pt no questions or concerns at this time. Will follow up with pcp.  

## 2021-06-30 NOTE — Discharge Instructions (Signed)
Please read and follow all provided instructions.  Your diagnoses today include:  1. Shortness of breath   2. Contusion of rib, unspecified laterality, initial encounter     Tests performed today include: Chest x-ray: Does not show pneumonia or broken ribs Blood counts and electrolytes: Shows slightly low red blood cell count Blood test for heart failure: Does not suggest heart failure Flu and COVID test: Were negative Vital signs. See below for your results today.   Medications prescribed:  Prednisone - steroid medicine   It is best to take this medication in the morning to prevent sleeping problems. If you are diabetic, monitor your blood sugar closely and stop taking Prednisone if blood sugar is over 300. Take with food to prevent stomach upset.   Take any prescribed medications only as directed.  Home care instructions:  Follow any educational materials contained in this packet.  Please use your breathing medications as prescribed by your doctor.  BE VERY CAREFUL not to take multiple medicines containing Tylenol (also called acetaminophen). Doing so can lead to an overdose which can damage your liver and cause liver failure and possibly death.   Follow-up instructions: Please follow-up with your primary care provider in the next 3 days for further evaluation of your symptoms.   Return instructions:  Please return to the Emergency Department if you experience worsening symptoms.  Return to the emergency department with worsening shortness of breath, trouble breathing, increased work of breathing, fever Please return if you have any other emergent concerns.  Additional Information:  Your vital signs today were: BP 127/71 (BP Location: Right Arm)    Pulse 61    Temp 97.8 F (36.6 C)    Resp 16    Ht 5\' 9"  (1.753 m)    Wt (!) 154.2 kg    SpO2 96%    BMI 50.21 kg/m  If your blood pressure (BP) was elevated above 135/85 this visit, please have this repeated by your doctor  within one month. --------------

## 2021-07-24 ENCOUNTER — Ambulatory Visit: Payer: Medicare Other | Admitting: Podiatry

## 2021-08-08 ENCOUNTER — Other Ambulatory Visit: Payer: Self-pay

## 2021-08-08 MED ORDER — ALBUTEROL SULFATE HFA 108 (90 BASE) MCG/ACT IN AERS: 2.0000 | INHALATION_SPRAY | Freq: Four times a day (QID) | RESPIRATORY_TRACT | 2 refills | Status: DC | PRN

## 2021-08-13 ENCOUNTER — Other Ambulatory Visit (HOSPITAL_COMMUNITY): Payer: Self-pay | Admitting: Surgery

## 2021-08-13 ENCOUNTER — Ambulatory Visit (INDEPENDENT_AMBULATORY_CARE_PROVIDER_SITE_OTHER): Payer: Medicare Other | Admitting: Podiatry

## 2021-08-13 ENCOUNTER — Other Ambulatory Visit: Payer: Self-pay

## 2021-08-13 ENCOUNTER — Other Ambulatory Visit: Payer: Self-pay | Admitting: Surgery

## 2021-08-13 ENCOUNTER — Encounter: Payer: Self-pay | Admitting: Podiatry

## 2021-08-13 DIAGNOSIS — B351 Tinea unguium: Secondary | ICD-10-CM

## 2021-08-13 DIAGNOSIS — M779 Enthesopathy, unspecified: Secondary | ICD-10-CM | POA: Diagnosis not present

## 2021-08-13 DIAGNOSIS — M79675 Pain in left toe(s): Secondary | ICD-10-CM | POA: Diagnosis not present

## 2021-08-13 DIAGNOSIS — M79674 Pain in right toe(s): Secondary | ICD-10-CM | POA: Diagnosis not present

## 2021-08-14 NOTE — Progress Notes (Signed)
Subjective:  ? ?Patient ID: Frank May, male   DOB: 61 y.o.   MRN: [id number]  ? ?HPI ?Patient presents with severely thickened deformed nailbeds 1-5 both feet that have not been taking care of in a long time.  Also admits he smokes a pack of cigarettes per day and is not active ? ? ?Review of Systems  ?All other systems reviewed and are negative. ? ? ?   ?Objective:  ?Physical Exam ?Vitals and nursing note reviewed.  ?Constitutional:   ?   Appearance: He is well-developed.  ?Pulmonary:  ?   Effort: Pulmonary effort is normal.  ?Musculoskeletal:     ?   General: Normal range of motion.  ?Skin: ?   General: Skin is warm.  ?Neurological:  ?   Mental Status: He is alert.  ?  ?Neurovascular status intact muscle strength was found to be adequate range of motion subtalar midtarsal joint adequate.  Patient is found to have severely elongated thickened nailbeds 1-5 both feet that are dystrophic and they are painful when pressed.  Patient has good digital perfusion well oriented x3 ? ?   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 ?   ?Plan:  ?H&P reviewed his condition and we discussed also ingrown component to his nailbeds and I did discuss nail removal educating him on nail removal and the long-term recovery from this.  At this point I debrided nailbeds 1-5 both feet no iatrogenic bleeding this can be done or nail removal can be considered in the future again which was discussed at length ?   ? ? ?

## 2021-08-28 ENCOUNTER — Ambulatory Visit: Payer: Medicare Other | Admitting: Pulmonary Disease

## 2021-09-01 ENCOUNTER — Encounter: Payer: Self-pay | Admitting: Skilled Nursing Facility1

## 2021-09-01 ENCOUNTER — Encounter: Payer: Medicare Other | Attending: Surgery | Admitting: Skilled Nursing Facility1

## 2021-09-01 DIAGNOSIS — Z6841 Body Mass Index (BMI) 40.0 and over, adult: Secondary | ICD-10-CM | POA: Diagnosis not present

## 2021-09-01 DIAGNOSIS — E669 Obesity, unspecified: Secondary | ICD-10-CM

## 2021-09-01 DIAGNOSIS — Z713 Dietary counseling and surveillance: Secondary | ICD-10-CM | POA: Diagnosis not present

## 2021-09-01 NOTE — Progress Notes (Signed)
Nutrition Assessment for Bariatric Surgery ?Medical Nutrition Therapy ?Appt Start Time: 10:48    End Time: 12:00 ? ?Patient was seen on 09/01/2021 for Pre-Operative Nutrition Assessment. Letter of approval faxed to [facility] bariatric surgery program coordinator on 09/01/2021.  ? ?Referral stated Supervised Weight Loss (SWL) visits needed: 6; pt states his insurance does not require 6 months of SWL Dietitian advised he can call his insurance company if he is unsure pt states know that is okay he will believe what he is advised  ? ?Planned surgery: Sleeve ?Pt expectation of surgery: to lose weight ?Pt expectation of dietitian: none reported ? ?  ?NUTRITION ASSESSMENT ?  ?Anthropometrics  ?Start weight at [facility]: 353 lbs (date: 09/01/2021)  ?Height: 69.5 in ?BMI: 51.38 kg/m2   ?  ?Clinical  ?Medical hx: GERD, HTN, COPD, CHF ?Medications: see list: omeprazole    ?Labs: total protein: 8.5, alkaline phosphatase 36, hemoglobin 12.3, HCT 38.8, NCV 74.5, MCH 23.6, RDW 17.2 ?Notable signs/symptoms: shortness of breath with stairs  ?Any previous deficiencies? No ? ?Micronutrient Nutrition Focused Physical Exam: ?Hair: No issues observed ?Eyes: No issues observed ?Mouth: No issues observed ?Neck: No issues observed ?Nails: No issues observed ?Skin: No issues observed ? ?Lifestyle & Dietary Hx ? ?Pt states he needs to take his omeprazole multiple times a week sometimes when not avoiding pork, greasy foods, and spicy foods.   ?Pt states since increasing non starchy vegetables and fruits he does not need constipation medication. ?Pt states he cares for his wife's adopted grandson who has ADHD and lives in their home.  ?Pt states he goes grocery shopping and keeps up with the duties of the house counting that as enough physical activity. "Im 61, and I aint suppose to do but so much exercise; stating the recommendation for "....physical activity is ridiculous" ?Pt states he also helps care for his grandsons grandfather  Jari Favre), stating he takes care of him when his wife is not there. ?Pt states he loves to cook. ?Pt states he will reward himself with chips when he does good with Blue. ?Pt states he knows what to do for wt loss, but it doesn't work, and that is why he needs surgery.  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 ?Pt denies Dx of CHF and COPD, pt states he is healthy ? ? ?24-Hr Dietary Recall:  ?wakes at 4am; having  ?First Meal: coffee ?Snack 10am: 2-3 eggs fried or fried bologna sandwich ?Second Meal: vegetables as a snack 2 times a week ?Snack: canned greens or green beans + onion ?Third Meal 5:30am: baked chicken + 2 slices bread  ?Snack:  ?Beverages: water, water + flavorings, coffee ?  ?Estimated Energy Needs ?Calories: 1700 ? ? ?NUTRITION DIAGNOSIS  ?Overweight/obesity ([place]-3.3) related to past poor dietary habits and physical inactivity as evidenced by patient w/ planned sleeve gastrectomy surgery following dietary guidelines for continued weight loss. ?  ? ?NUTRITION INTERVENTION  ?Nutrition counseling (C-1) and education (E-2) to facilitate bariatric surgery  goals. ? ?Educated pt on micronutrient deficiencies post surgery and strategies to mitigate that risk ? ?Educated patient on dietary labels for serving sizes ?Educated patient on bariatric surgery handouts and available goals to start working on ?Educated on physical activity recommendations of 30-60 minutes 6 times a week ? ?  ?Pre-Op Goals Reviewed with the  Patient ?Track food and beverage intake (pen and paper, MyFitness Pal, [employer] app, etc.) ?Make healthy food choices while monitoring portion sizes ?Consume 3 meals per day or try to eat every 3-5 hours ?Avoid concentrated sugars and fried foods ?Keep sugar & fat in the single digits per serving on food labels ?Practice CHEWING your food (aim for applesauce consistency) ?Practice not drinking 15 minutes before, during, and 30 minutes after each meal and snack ?Avoid all carbonated beverages (ex: soda, sparkling beverages)  ?Limit caffeinated beverages (ex: coffee, tea, energy drinks) ?Avoid all sugar-sweetened beverages (ex: regular soda, sports drinks)  ?Avoid alcohol  ?Aim for 64-100 ounces of FLUID daily (with at least half of fluid intake being plain water)  ?Aim for at least 60-80 grams of PROTEIN daily ?Look for a liquid protein source that contains ?15 g protein and ?5 g carbohydrate (ex: shakes, drinks, shots) ?Make a list of non-food related activities ?Physical activity is an important part of a healthy lifestyle so keep it moving! The goal is to reach 150 minutes of exercise per week, including cardiovascular and weight baring activity. ?Patient states he does not wish to work on any goals at this time ? ?*Goals that are bolded indicate the pt would like to start working towards these ? ?Handouts Provided Include  ?Bariatric Surgery handouts (Nutrition Visits, Pre-Op Goals, Protein Shakes, Vitamins & Minerals) ? ?Learning Style & Readiness for Change ?Teaching method utilized: Visual & Auditory  ?Demonstrated degree of understanding via: Teach Back  ?Readiness Level: pre-contemplative  ?Barriers to learning/adherence to lifestyle change:  dismissal of education ? ?RD's Notes for Next Visit ?Identify a goal for next visit ?  ?  ?MONITORING & EVALUATION ?Dietary intake, weekly physical activity, body weight, and pre-op goals reached at next nutrition visit.  ?  ?Next Steps  ?Patient is to follow up at  [facility] for Pre-Op Class >2 weeks before surgery for further nutrition education.  ?For next nutrition appointment. ?

## 2021-09-15 ENCOUNTER — Ambulatory Visit: Payer: Medicare Other | Admitting: Skilled Nursing Facility1

## 2021-10-15 ENCOUNTER — Ambulatory Visit: Payer: Medicare Other | Admitting: Skilled Nursing Facility1

## 2021-11-12 ENCOUNTER — Ambulatory Visit: Payer: Medicare HMO | Admitting: Podiatry

## 2021-11-12 ENCOUNTER — Encounter: Payer: Medicare HMO | Attending: Surgery | Admitting: Skilled Nursing Facility1

## 2021-11-12 DIAGNOSIS — Z713 Dietary counseling and surveillance: Secondary | ICD-10-CM | POA: Diagnosis not present

## 2021-11-12 DIAGNOSIS — Z6841 Body Mass Index (BMI) 40.0 and over, adult: Secondary | ICD-10-CM | POA: Insufficient documentation

## 2021-11-12 DIAGNOSIS — E669 Obesity, unspecified: Secondary | ICD-10-CM | POA: Insufficient documentation

## 2021-11-12 NOTE — Progress Notes (Signed)
Supervised Weight Loss Visit Bariatric Nutrition Education  Sleeve gastrectomy   1 out of 6 [facility] Appointments   NUTRITION ASSESSMENT  Anthropometrics  Start weight at [facility]: 353 lbs (date: 09/01/2021)  Weight: 335.3 BMI: 48.85 kg/m2     Clinical  Medical hx: GERD, HTN, COPD, CHF Medications: see list: omeprazole    Labs: total protein: 8.5, alkaline phosphatase 36, hemoglobin 12.3, HCT 38.8, NCV 74.5, MCH 23.6, RDW 17.2 Notable signs/symptoms: shortness of breath with stairs  Any previous deficiencies? No  Lifestyle & Dietary Hx  Pt states he started Rybelsus having lost 18 pounds from his previous visit.  Pt states the medicine he takes keeps him from feeling hungry but then also states he has never eaten throughout the day. Pt states he wakes around 4am and starts eating around 2-5pm also not until 7pm.   Pt states If he is feeling funny he will eat crackers and peanut butter or fruit in the middle of the day.   Pt states his goal is to lose 135 pounds to be 200 pounds stating that's when he really needs a dietitian to stay at that weight. Right now my weight is 335 with a  goal of getting to a loss of 135 pounds from rybelsus then wants to maintain at 200 pounds and then that is when he wants bariatric surgery when he gets to 200 pounds.    Pt states he is not quitting smoking he states he will never get an ulcer again stating he will quit when he absolutely has to and is pretty sure he will never pick it back up again once he does quit.   Estimated daily fluid intake:  oz Supplements:  Current average weekly physical activity: ADL's  24-Hr Dietary Recall First Meal:  Snack:  Second Meal:  Snack:  Third Meal 7:30pm: mashed potatoes and steak  Snack:  Beverages: coffee + regular flavored creamer (1/4 cup), water, soda every 2-3 weeks  Estimated Energy Needs Calories: 1800   NUTRITION DIAGNOSIS  Overweight/obesity (-3.3) related to past poor dietary habits and  physical inactivity as evidenced by patient w/ planned sleeve gastrectomy surgery following dietary guidelines for continued weight loss.   NUTRITION INTERVENTION  Nutrition counseling (C-1) and education (E-2) to facilitate bariatric surgery goals.  Pre-Op Goals Progress & New Goals Eat throughout the day  Handouts Provided Include  Meal Ideas Sheet  Learning Style & Readiness for Change Teaching method utilized: Visual & Auditory  Demonstrated degree of understanding via: Teach Back  Readiness Level: pre-contemplative  Barriers to learning/adherence to lifestyle change: obstinace to change  RD's Notes for next Visit  Assess pts adherence to chosen goals   MONITORING & EVALUATION Dietary intake, weekly physical activity, body weight, and pre-op goals in 1 month.   Next Steps  Patient is to return to [facility] in 1 month

## 2021-12-11 ENCOUNTER — Ambulatory Visit: Payer: Medicare HMO | Admitting: Dietician

## 2022-01-12 ENCOUNTER — Ambulatory Visit: Payer: Medicare HMO | Admitting: Skilled Nursing Facility1

## 2022-01-15 ENCOUNTER — Encounter: Payer: Medicare HMO | Attending: Physician Assistant | Admitting: Dietician

## 2022-01-15 ENCOUNTER — Encounter: Payer: Self-pay | Admitting: Dietician

## 2022-01-15 DIAGNOSIS — Z6841 Body Mass Index (BMI) 40.0 and over, adult: Secondary | ICD-10-CM | POA: Diagnosis not present

## 2022-01-15 DIAGNOSIS — I509 Heart failure, unspecified: Secondary | ICD-10-CM | POA: Insufficient documentation

## 2022-01-15 DIAGNOSIS — I110 Hypertensive heart disease with heart failure: Secondary | ICD-10-CM | POA: Diagnosis not present

## 2022-01-15 DIAGNOSIS — J449 Chronic obstructive pulmonary disease, unspecified: Secondary | ICD-10-CM | POA: Insufficient documentation

## 2022-01-15 DIAGNOSIS — Z713 Dietary counseling and surveillance: Secondary | ICD-10-CM | POA: Diagnosis not present

## 2022-01-15 DIAGNOSIS — F17210 Nicotine dependence, cigarettes, uncomplicated: Secondary | ICD-10-CM | POA: Diagnosis not present

## 2022-01-15 DIAGNOSIS — K219 Gastro-esophageal reflux disease without esophagitis: Secondary | ICD-10-CM | POA: Diagnosis not present

## 2022-01-15 DIAGNOSIS — E669 Obesity, unspecified: Secondary | ICD-10-CM

## 2022-01-15 NOTE — Progress Notes (Signed)
Supervised Weight Loss Visit Bariatric Nutrition Education  Sleeve gastrectomy   2 out of 6 SW[facility] Appointments   NUTRITION ASSESSMENT  Anthropometrics  Start weight at [facility]: 353 lbs (date: 09/01/2021)  Weight: 332.4 lbs BMI: 48.38 kg/m2     Clinical  Medical hx: GERD, HTN, COPD, CHF Medications: see list: omeprazole    Labs: total protein: 8.5, alkaline phosphatase 36, hemoglobin 12.3, HCT 38.8, NCV 74.5, MCH 23.6, RDW 17.2 Notable signs/symptoms: shortness of breath with stairs  Any previous deficiencies? No  Lifestyle & Dietary Hx  Pt states he is watching what he eats and exercising, stating swimming and walking a quarter of a mile, up to a mile daily. Pt states he is still taking Rybelsus, stating he does not have an appetite in the morning. Pt states some days he does not eat, stating that is good.  Dietitian encouraged pt to eat small meals throughout the day. Pt states he wakes at 4 am and takes wife to work and takes care of her son. Pt states his diet will not change because he already eats a lot of [place], and fresh food.  States he does not like fast food, so he cooks his own food.  Pt states he has cut back on smoking, stating a pack will last a day and a half, stating it used to only last one day.  Pt states he is not going to quit smoking right now.  Pt states all he has left is smoking, stating he used to drink a lot. Pt states I never breathed a sober breath when I was drinking. Pt agreeable to limiting processed meats and choosing lean cuts of meat.  Pt agreeable to choosing complex carbohydrates/ whole grain breads and crackers.   Estimated daily fluid intake:  oz Supplements:  Current average weekly physical activity: ADL's, walking the complex where he lives.  24-Hr Dietary Recall First Meal: peach or pear or skip Snack:  Second Meal: skip or fruit and bologna sandwich Snack:  Third Meal 7:30pm: mashed potatoes and steak  Snack:  Beverages: coffee + regular  flavored creamer (1/4 cup), water, soda every 2-3 weeks  Estimated Energy Needs Calories: 1800   NUTRITION DIAGNOSIS  Overweight/obesity ([place]-3.3) related to past poor dietary habits and physical inactivity as evidenced by patient w/ planned sleeve gastrectomy surgery following dietary guidelines for continued weight loss.   NUTRITION INTERVENTION  Nutrition counseling (C-1) and education (E-2) to facilitate bariatric surgery goals. Why you need complex carbohydrates: Whole grains and other complex carbohydrates are required to have a healthy diet. Whole grains provide fiber which can help with blood glucose levels and help keep you satiated. Fruits and starchy vegetables provide essential vitamins and minerals required for immune function, eyesight support, brain support, bone density, wound healing and many other functions within the body. According to the current evidenced based 2020-2025 Dietary Guidelines for Americans, complex carbohydrates are part of a healthy eating pattern which is associated with a decreased risk for type 2 diabetes, cancers, and cardiovascular disease.    Pre-Op Goals Progress & New Goals Continue: Eat small snacks/meals throughout the day (even with no appetite from Rybelsus) New: reduce processed meats and choose lean cuts of meat. New: choose complex carbohydrates / whole grain products when choosing breads, crackers, etc...  Handouts Provided Include    Learning Style & Readiness for Change Teaching method utilized: Visual & Auditory  Demonstrated degree of understanding via: Teach Back  Readiness Level: pre-contemplative  Barriers to learning/adherence to  lifestyle change: obstinace to change  RD's Notes for next Visit  Assess pts adherence to chosen goals   MONITORING & EVALUATION Dietary intake, weekly physical activity, body weight, and pre-op goals in 1 month.   Next Steps  Patient is to return to [facility] in 1 month

## 2022-02-12 ENCOUNTER — Encounter: Payer: Medicare HMO | Attending: Physician Assistant | Admitting: Dietician

## 2022-02-12 ENCOUNTER — Telehealth: Payer: Self-pay | Admitting: Dietician

## 2022-02-12 NOTE — Telephone Encounter (Signed)
Attempted to call patient for virtual appointment.  Patient not available for appointment.  Wife answered the call, stating she sent her husband to the store.  Dietitian waited for return call.  Patient recorded as "no show".

## 2022-03-12 ENCOUNTER — Encounter: Payer: Self-pay | Admitting: Dietician

## 2022-03-12 ENCOUNTER — Encounter: Payer: Medicare HMO | Attending: Surgery | Admitting: Dietician

## 2022-03-12 DIAGNOSIS — J449 Chronic obstructive pulmonary disease, unspecified: Secondary | ICD-10-CM | POA: Insufficient documentation

## 2022-03-12 DIAGNOSIS — I110 Hypertensive heart disease with heart failure: Secondary | ICD-10-CM | POA: Diagnosis not present

## 2022-03-12 DIAGNOSIS — K219 Gastro-esophageal reflux disease without esophagitis: Secondary | ICD-10-CM | POA: Diagnosis not present

## 2022-03-12 DIAGNOSIS — I509 Heart failure, unspecified: Secondary | ICD-10-CM | POA: Insufficient documentation

## 2022-03-12 DIAGNOSIS — Z6841 Body Mass Index (BMI) 40.0 and over, adult: Secondary | ICD-10-CM | POA: Insufficient documentation

## 2022-03-12 DIAGNOSIS — Z713 Dietary counseling and surveillance: Secondary | ICD-10-CM | POA: Insufficient documentation

## 2022-03-12 DIAGNOSIS — E669 Obesity, unspecified: Secondary | ICD-10-CM

## 2022-03-12 NOTE — Progress Notes (Signed)
Supervised Weight Loss Visit Bariatric Nutrition Education  Sleeve gastrectomy   3 out of 6 SWL Appointments   NUTRITION ASSESSMENT  Anthropometrics  Start weight at [facility]: 353 lbs (date: 09/01/2021)  Weight: 326.2 lbs BMI: 47.48 kg/m2     Clinical  Medical hx: GERD, HTN, COPD, CHF Medications: see list: omeprazole, Rybelsus   Labs: total protein: 8.5, alkaline phosphatase 36, hemoglobin 12.3, HCT 38.8, NCV 74.5, MCH 23.6, RDW 17.2 Notable signs/symptoms: shortness of breath with stairs  Any previous deficiencies? No  Lifestyle & Dietary Hx  Pt states he is on Rybelsus, stating he doesn't have much of an appetite. Pt states he can't remember what his goals are from the last visit. Pt states he and his wife switched to the whole grain noodles. Pt states all he drinks is water. Pt states he needs a schedule for remembering to eat throughout the day. Pt and dietitian worked on a schedule for pt to remember. Pt states he drinks a gallon of water a day.  Estimated daily fluid intake: 128 oz Supplements: n/a Current average weekly physical activity: ADL's, walking the complex where he lives (1/4 mile), stating he gets 1.5 miles a day between all he has to do. Take the dog out 3-4 times a day.  24-Hr Dietary Recall First Meal: peach or plum Snack:  Second Meal: skip or fruit and bologna sandwich Snack:  Third Meal 7:30pm: mashed potatoes and steak  Snack: fruit Beverages: coffee + regular flavored creamer (1/4 cup), water, soda every 2-3 weeks  Estimated Energy Needs Calories: 1800   NUTRITION DIAGNOSIS  Overweight/obesity ([place]-3.3) related to past poor dietary habits and physical inactivity as evidenced by patient w/ planned sleeve gastrectomy surgery following dietary guidelines for continued weight loss.   NUTRITION INTERVENTION  Nutrition counseling (C-1) and education (E-2) to facilitate bariatric surgery goals. Why you need complex carbohydrates: Whole grains and  other complex carbohydrates are required to have a healthy diet. Whole grains provide fiber which can help with blood glucose levels and help keep you satiated. Fruits and starchy vegetables provide essential vitamins and minerals required for immune function, eyesight support, brain support, bone density, wound healing and many other functions within the body. According to the current evidenced based 2020-2025 Dietary Guidelines for Americans, complex carbohydrates are part of a healthy eating pattern which is associated with a decreased risk for type 2 diabetes, cancers, and cardiovascular disease.   Purpose of protein: Every cell in your body has protein. Protein is essential for the structure, function and regulation of tissues and organs within the body. Without protein enzymes and antibodies would not exist, and cells would lack storage, transportation, and messenger systems. According to [facility] [place], the body is made up of at least 10000 different proteins. Lack of protein can lead to growth failure in children, loss of muscle mass, decreased immune system function, and overall weakening of various organs in the body.  [email or web address], [email or web address], [email or web address]    Pre-Op Goals Progress & New Goals Continue: Eat small snacks/meals throughout the day (even with no appetite from Rybelsus) Continue: reduce processed meats and choose lean cuts of meat. Continue: choose complex carbohydrates / whole grain products when choosing breads, crackers, etc... New: use schedule created for meal times and snack times. Pair snacks with a protein New: find protein shake flavors you like, as well as sugar free protein powder.  Handouts Provided Include  Written schedule for meal and snack times Pt goals written out  Learning Style & Readiness for  Change Teaching method utilized: Visual & Auditory  Demonstrated degree of understanding via: Teach Back  Readiness Level: pre-contemplative  Barriers to learning/adherence to lifestyle change: obstinace to change  RD's Notes for next Visit  Assess pts adherence to chosen goals   MONITORING & EVALUATION Dietary intake, weekly physical activity, body weight, and pre-op goals in 1 month.   Next Steps  Patient is to return to [facility] in 1 month for next [facility] visit.

## 2022-04-14 ENCOUNTER — Encounter: Payer: Medicare HMO | Attending: Physician Assistant | Admitting: Dietician

## 2022-04-14 ENCOUNTER — Encounter: Payer: Self-pay | Admitting: Dietician

## 2022-04-14 DIAGNOSIS — I509 Heart failure, unspecified: Secondary | ICD-10-CM | POA: Insufficient documentation

## 2022-04-14 DIAGNOSIS — E669 Obesity, unspecified: Secondary | ICD-10-CM

## 2022-04-14 DIAGNOSIS — I110 Hypertensive heart disease with heart failure: Secondary | ICD-10-CM | POA: Insufficient documentation

## 2022-04-14 DIAGNOSIS — Z713 Dietary counseling and surveillance: Secondary | ICD-10-CM | POA: Insufficient documentation

## 2022-04-14 DIAGNOSIS — Z6841 Body Mass Index (BMI) 40.0 and over, adult: Secondary | ICD-10-CM | POA: Insufficient documentation

## 2022-04-14 DIAGNOSIS — K219 Gastro-esophageal reflux disease without esophagitis: Secondary | ICD-10-CM | POA: Insufficient documentation

## 2022-04-14 DIAGNOSIS — J449 Chronic obstructive pulmonary disease, unspecified: Secondary | ICD-10-CM | POA: Diagnosis not present

## 2022-04-14 NOTE — Progress Notes (Signed)
Supervised Weight Loss Visit Bariatric Nutrition Education  Sleeve gastrectomy   4 out of 6 SWL Appointments   I connected with pt by phone and verified that I am speaking with the correct person using two identifiers.I discussed the limitations of a this type of visit. The patient expressed understanding and agreed to proceed.    NUTRITION ASSESSMENT  Anthropometrics  Start weight at [facility]: 353 lbs (date: 09/01/2021)  Weight: no weight today (phone appointment) BMI: 47.48 kg/m2     Clinical  Medical hx: GERD, HTN, COPD, CHF Medications: see list: omeprazole, Rybelsus   Labs: total protein: 8.5, alkaline phosphatase 36, hemoglobin 12.3, HCT 38.8, NCV 74.5, MCH 23.6, RDW 17.2 Notable signs/symptoms: shortness of breath with stairs  Any previous deficiencies? No  Lifestyle & Dietary Hx  Pt states he is eating fruit to get more fiber. Pt states he is eating throughout the day, but states that his his pain doctor (from the pain clinic) told him to cut back eating so much, stating he had gained weight. Pt states he weighed at 325.  Pt states he still smokes cigarettes, stating he is not rushing to quit smoking. Pt states he does not drink with meals, stating he has been doing that. Pt states he still drinks a gallon of water a day. When asked about physical activity, pt states, I told you before, I'm up at 4:30 and on the move all the time. Pt states he is still taking Rybelsus.  Estimated daily fluid intake: 128 oz Supplements: n/a Current average weekly physical activity: ADL's, walking the complex where he lives (1/4 mile), stating he gets 1.5 miles a day between all he has to do. Take the dog out 3-4 times a day.  24-Hr Dietary Recall First Meal: peach or plum Snack:  Second Meal: skip or fruit and bologna sandwich Snack:  Third Meal 7:30pm: mashed potatoes and steak  Snack: fruit Beverages: coffee + regular flavored creamer (1/4 cup), water, soda every 2-3 weeks  Estimated  Energy Needs Calories: 1800   NUTRITION DIAGNOSIS  Overweight/obesity ([place]-3.3) related to past poor dietary habits and physical inactivity as evidenced by patient w/ planned sleeve gastrectomy surgery following dietary guidelines for continued weight loss.   NUTRITION INTERVENTION  Nutrition counseling (C-1) and education (E-2) to facilitate bariatric surgery goals.   Pre-Op Goals Progress & New Goals Continue: Eat small snacks/meals throughout the day (even with no appetite from Rybelsus) Continue: reduce processed meats and choose lean cuts of meat. Continue: choose complex carbohydrates / whole grain products when choosing breads, crackers, etc... Continue: use schedule created for meal times and snack times. Pair snacks with a protein Continue again: find protein shake flavors you like, as well as sugar free protein powder.  Handouts Provided Include   Learning Style & Readiness for Change Teaching method utilized: Visual & Auditory  Demonstrated degree of understanding via: Teach Back  Readiness Level: pre-contemplative  Barriers to learning/adherence to lifestyle change: obstinace to change  RD's Notes for next Visit  Assess pts adherence to chosen goals   MONITORING & EVALUATION Dietary intake, weekly physical activity, body weight, and pre-op goals in 1 month.   Next Steps  Patient is to return to [facility] in 1 month for next [facility] visit.

## 2022-05-14 ENCOUNTER — Encounter: Payer: Medicare HMO | Attending: Surgery | Admitting: Dietician

## 2022-05-14 ENCOUNTER — Encounter: Payer: Self-pay | Admitting: Dietician

## 2022-05-14 VITALS — Ht 69.5 in | Wt 316.0 lb

## 2022-05-14 DIAGNOSIS — Z6841 Body Mass Index (BMI) 40.0 and over, adult: Secondary | ICD-10-CM | POA: Diagnosis not present

## 2022-05-14 DIAGNOSIS — Z731 Type A behavior pattern: Secondary | ICD-10-CM | POA: Diagnosis not present

## 2022-05-14 DIAGNOSIS — E669 Obesity, unspecified: Secondary | ICD-10-CM | POA: Insufficient documentation

## 2022-05-14 DIAGNOSIS — Z79899 Other long term (current) drug therapy: Secondary | ICD-10-CM | POA: Insufficient documentation

## 2022-05-14 NOTE — Progress Notes (Signed)
Supervised Weight Loss Visit Bariatric Nutrition Education  Sleeve gastrectomy   5 out of 6 SWL Appointments   NUTRITION ASSESSMENT  Anthropometrics  Start weight at [facility]: 353 lbs (date: 09/01/2021)  Height: 69.5 in Weight today:  316.0 lb BMI: 46.00 kg/m2     Clinical  Medical hx: GERD, HTN, COPD, CHF Medications: see list: omeprazole, Rybelsus   Labs: total protein: 8.5, alkaline phosphatase 36, hemoglobin 12.3, HCT 38.8, NCV 74.5, MCH 23.6, RDW 17.2 Notable signs/symptoms: shortness of breath with stairs  Any previous deficiencies? No  Lifestyle & Dietary Hx  Pt states that he is getting 75-80 grams of protein a day. Pt states he does not drink soda any more, stating not sweets either. Pt states he is eating fish several times a week, stating more than he has at any other time in his life. Pt states he is doing everything he needs to, stating he does not have anything to work on. Pt states he checks his blood sugar twice a day, stating when he wakes up and when he goes to bed.  Dietitian recommended he check his blood sugar 2 hours after a meal, and continue to check blood sugar in the morning (fasting). Pt states he is still taking Rybelsus. Pt continues to lose weight each visit.  Estimated daily fluid intake: 128 oz Supplements: n/a Current average weekly physical activity: ADL's, walking the complex where he lives (1/4 mile), stating he gets 1.5 miles a day between all he has to do. Take the dog out 3-4 times a day.  24-Hr Dietary Recall First Meal: peach or plum Snack:  Second Meal: skip or fruit and bologna sandwich Snack:  Third Meal 7:30pm: mashed potatoes and steak  Snack: fruit Beverages: coffee + regular flavored creamer (1/4 cup), water, soda every 2-3 weeks  Estimated Energy Needs Calories: 1800  NUTRITION DIAGNOSIS  Overweight/obesity ([place]-3.3) related to past poor dietary habits and physical inactivity as evidenced by patient w/ planned sleeve  gastrectomy surgery following dietary guidelines for continued weight loss.   NUTRITION INTERVENTION  Nutrition counseling (C-1) and education (E-2) to facilitate bariatric surgery goals.   Pre-Op Goals Progress & New Goals Continue: Eat small snacks/meals throughout the day (even with no appetite from Rybelsus) Continue: reduce processed meats and choose lean cuts of meat. Continue: choose complex carbohydrates / whole grain products when choosing breads, crackers, etc... Continue: use schedule created for meal times and snack times. Pair snacks with a protein Continue again: find protein shake flavors you like, as well as sugar free protein powder. Continue: tracking daily protein intake New: check blood sugar 2 hours after a meal  Handouts Provided Include  Benefits of Physical Activity Handout Pre-Op Goals handout for review  Learning Style & Readiness for Change Teaching method utilized: Visual & Auditory  Demonstrated degree of understanding via: Teach Back  Readiness Level: pre-contemplative  Barriers to learning/adherence to lifestyle change: obstinace to change  RD's Notes for next Visit  Assess pts adherence to chosen goals   MONITORING & EVALUATION Dietary intake, weekly physical activity, body weight, and pre-op goals in 1 month.   Next Steps  Patient is to return to [facility] in 1 month for next [facility] visit.

## 2022-05-17 ENCOUNTER — Other Ambulatory Visit: Payer: Self-pay | Admitting: Pulmonary Disease

## 2022-06-12 ENCOUNTER — Other Ambulatory Visit (HOSPITAL_COMMUNITY): Payer: Self-pay | Admitting: Surgery

## 2022-06-18 ENCOUNTER — Ambulatory Visit: Payer: Medicare HMO | Admitting: Dietician

## 2022-06-23 ENCOUNTER — Ambulatory Visit: Payer: Medicare HMO | Admitting: Dietician

## 2022-06-26 ENCOUNTER — Other Ambulatory Visit: Payer: Self-pay | Admitting: *Deleted

## 2022-06-26 DIAGNOSIS — Z87891 Personal history of nicotine dependence: Secondary | ICD-10-CM

## 2022-06-26 DIAGNOSIS — F17210 Nicotine dependence, cigarettes, uncomplicated: Secondary | ICD-10-CM

## 2022-06-26 DIAGNOSIS — Z122 Encounter for screening for malignant neoplasm of respiratory organs: Secondary | ICD-10-CM

## 2022-07-13 ENCOUNTER — Encounter (HOSPITAL_COMMUNITY): Payer: Self-pay

## 2022-07-13 ENCOUNTER — Inpatient Hospital Stay (HOSPITAL_COMMUNITY): Admission: RE | Admit: 2022-07-13 | Payer: Medicare HMO | Source: Ambulatory Visit

## 2022-07-13 ENCOUNTER — Other Ambulatory Visit (HOSPITAL_COMMUNITY): Payer: Medicare HMO

## 2022-08-05 ENCOUNTER — Encounter: Payer: Self-pay | Admitting: Acute Care

## 2022-08-05 ENCOUNTER — Ambulatory Visit (INDEPENDENT_AMBULATORY_CARE_PROVIDER_SITE_OTHER): Payer: Medicare HMO | Admitting: Acute Care

## 2022-08-05 DIAGNOSIS — F17210 Nicotine dependence, cigarettes, uncomplicated: Secondary | ICD-10-CM

## 2022-08-05 NOTE — Patient Instructions (Signed)
Thank you for participating in the [facility]. It was our pleasure to meet you today. We will call you with the results of your scan within the next few days. Your scan will be assigned a Lung RADS category score by the physicians reading the scans.  This Lung RADS score determines follow up scanning.  See below for description of categories, and follow up screening recommendations. We will be in touch to schedule your follow up screening annually or based on recommendations of our providers. We will fax a copy of your scan results to your Primary Care Physician, or the physician who referred you to the program, to ensure they have the results. Please call the office if you have any questions or concerns regarding your scanning experience or results.  Our office number is [phone]. Please speak with Denise Phelps, RN. , or  Denise Buckner RN, They are  our Lung Cancer Screening RN.'s If They are unavailable when you call, Please leave a message on the voice mail. We will return your call at our earliest convenience.This voice mail is monitored several times a day.  Remember, if your scan is normal, we will scan you annually as long as you continue to meet the criteria for the program. (Age 50-80, Current smoker or smoker who has quit within the last 15 years). If you are a smoker, remember, quitting is the single most powerful action that you can take to decrease your risk of lung cancer and other pulmonary, breathing related problems. We know quitting is hard, and we are here to help.  Please let us know if there is anything we can do to help you meet your goal of quitting. If you are a former smoker, congratulations. We are proud of you! Remain smoke free! Remember you can refer friends or family members through the number above.  We will screen them to make sure they meet criteria for the program. Thank you for helping us take better care of you by  participating in Lung Screening.  You can receive free nicotine replacement therapy ( patches, gum or mints) by calling 1-800-QUIT NOW. Please call so we can get you on the path to becoming  a non-smoker. I know it is hard, but you can do this!  Lung RADS Categories:  Lung RADS 1: no nodules or definitely non-concerning nodules.  Recommendation is for a repeat annual scan in 12 months.  Lung RADS 2:  nodules that are non-concerning in appearance and behavior with a very low likelihood of becoming an active cancer. Recommendation is for a repeat annual scan in 12 months.  Lung RADS 3: nodules that are probably non-concerning , includes nodules with a low likelihood of becoming an active cancer.  Recommendation is for a 6-month repeat screening scan. Often noted after an upper respiratory illness. We will be in touch to make sure you have no questions, and to schedule your 6-month scan.  Lung RADS 4 A: nodules with concerning findings, recommendation is most often for a follow up scan in 3 months or additional testing based on our provider's assessment of the scan. We will be in touch to make sure you have no questions and to schedule the recommended 3 month follow up scan.  Lung RADS 4 B:  indicates findings that are concerning. We will be in touch with you to schedule additional diagnostic testing based on our provider's  assessment of the scan.  Other options for assistance in smoking cessation (   As covered by your insurance benefits)  Hypnosis for smoking cessation  [employer] [phone]  Acupuncture for smoking cessation  [facility] [phone]   

## 2022-08-05 NOTE — Progress Notes (Signed)
Virtual Visit via Telephone Note  I connected with Frank May on 08/05/22 at 11:30 AM EST by telephone and verified that I am speaking with the correct person using two identifiers.  Location: Patient:  At home Provider:  [address], [place], [place], [address]    I discussed the limitations, risks, security and privacy concerns of performing an evaluation and management service by telephone and the availability of in person appointments. I also discussed with the patient that there may be a patient responsible charge related to this service. The patient expressed understanding and agreed to proceed.    Shared Decision Making Visit Lung Cancer Screening Program [id number])   Eligibility: Age 62 y.o. Pack Years Smoking History Calculation 65 pack year smoking history (# packs/per year x # years smoked) Recent History of coughing up blood  no Unexplained weight loss? no ( >Than 15 pounds within the last 6 months ) Prior History Lung / other cancer no (Diagnosis within the last 5 years already requiring surveillance chest CT Scans). Smoking Status Current Smoker Former Smokers: Years since quit:  NA  Quit Date:  NA  Visit Components: Discussion included one or more decision making aids. yes Discussion included risk/benefits of screening. yes Discussion included potential follow up diagnostic testing for abnormal scans. yes Discussion included meaning and risk of over diagnosis. yes Discussion included meaning and risk of False Positives. yes Discussion included meaning of total radiation exposure. yes  Counseling Included: Importance of adherence to annual lung cancer LDCT screening. yes Impact of comorbidities on ability to participate in the program. yes Ability and willingness to under diagnostic treatment. yes  Smoking Cessation Counseling: Current Smokers:  Discussed importance of smoking cessation. yes Information about tobacco cessation classes and  interventions provided to patient. yes Patient provided with "ticket" for LDCT Scan. yes Symptomatic Patient. no  Counseling NA Diagnosis Code: Tobacco Use Z72.0 Asymptomatic Patient yes  Counseling (Intermediate counseling: > three minutes counseling) [id number] Former Smokers:  Discussed the importance of maintaining cigarette abstinence. yes Diagnosis Code: Personal History of Nicotine Dependence. [id number] Information about tobacco cessation classes and interventions provided to patient. Yes Patient provided with "ticket" for LDCT Scan. yes Written Order for Lung Cancer Screening with LDCT placed in Epic. Yes (CT Chest Lung Cancer Screening Low Dose W/O CM) [id number] Z12.2-Screening of respiratory organs Z87.891-Personal history of nicotine dependence  I have spent 25 minutes of face to face/ virtual visit   time with  Frank May discussing the risks and benefits of lung cancer screening. We viewed / discussed a power point together that explained in detail the above noted topics. We paused at intervals to allow for questions to be asked and answered to ensure understanding.We discussed that the single most powerful action that he can take to decrease his risk of developing lung cancer is to quit smoking. We discussed whether or not he is ready to commit to setting a quit date. We discussed options for tools to aid in quitting smoking including nicotine replacement therapy, non-nicotine medications, support groups, Quit Smart classes, and behavior modification. We discussed that often times setting smaller, more achievable goals, such as eliminating 1 cigarette a day for a week and then 2 cigarettes a day for a week can be helpful in slowly decreasing the number of cigarettes smoked. This allows for a sense of accomplishment as well as providing a clinical benefit. I provided  him  with smoking cessation  information  with contact information for community resources,  classes, free nicotine replacement  therapy, and access to mobile apps, text messaging, and on-line smoking cessation help. I have also provided  him  the office contact information in the event he needs to contact me, or the screening staff. We discussed the time and location of the scan, and that either Doroteo Glassman RN, Joella Prince, RN  or I will call / send a letter with the results within 24-72 hours of receiving them. The patient verbalized understanding of all of  the above and had no further questions upon leaving the office. They have my contact information in the event they have any further questions.  I spent 3 minutes counseling on smoking cessation and the health risks of continued tobacco abuse.  I explained to the patient that there has been a high incidence of coronary artery disease noted on these exams. I explained that this is a non-gated exam therefore degree or severity cannot be determined. This patient is on statin therapy. I have asked the patient to follow-up with their PCP regarding any incidental finding of coronary artery disease and management with diet or medication as their PCP  feels is clinically indicated. The patient verbalized understanding of the above and had no further questions upon completion of the visit.      Magdalen Spatz, NP 08/05/2022

## 2022-08-06 ENCOUNTER — Inpatient Hospital Stay: Admission: RE | Admit: 2022-08-06 | Payer: Medicare HMO | Source: Ambulatory Visit

## 2022-08-19 ENCOUNTER — Inpatient Hospital Stay: Admission: RE | Admit: 2022-08-19 | Payer: Medicare HMO | Source: Ambulatory Visit

## 2022-09-14 ENCOUNTER — Inpatient Hospital Stay: Admission: RE | Admit: 2022-09-14 | Payer: Medicare HMO | Source: Ambulatory Visit

## 2022-10-17 ENCOUNTER — Other Ambulatory Visit: Payer: Self-pay | Admitting: Pulmonary Disease

## 2023-01-27 ENCOUNTER — Other Ambulatory Visit: Payer: Self-pay

## 2023-01-27 ENCOUNTER — Encounter (HOSPITAL_BASED_OUTPATIENT_CLINIC_OR_DEPARTMENT_OTHER): Payer: Self-pay

## 2023-01-27 ENCOUNTER — Emergency Department (HOSPITAL_BASED_OUTPATIENT_CLINIC_OR_DEPARTMENT_OTHER)
Admission: EM | Admit: 2023-01-27 | Discharge: 2023-01-27 | Disposition: A | Discharge status: Home / Self Care | Payer: Medicare HMO | Attending: Emergency Medicine | Admitting: Emergency Medicine

## 2023-01-27 DIAGNOSIS — M25471 Effusion, right ankle: Secondary | ICD-10-CM

## 2023-01-27 DIAGNOSIS — R600 Localized edema: Secondary | ICD-10-CM | POA: Insufficient documentation

## 2023-01-27 DIAGNOSIS — I10 Essential (primary) hypertension: Secondary | ICD-10-CM | POA: Diagnosis not present

## 2023-01-27 DIAGNOSIS — Z79899 Other long term (current) drug therapy: Secondary | ICD-10-CM | POA: Diagnosis not present

## 2023-01-27 DIAGNOSIS — Z7982 Long term (current) use of aspirin: Secondary | ICD-10-CM | POA: Insufficient documentation

## 2023-01-27 DIAGNOSIS — M7989 Other specified soft tissue disorders: Secondary | ICD-10-CM | POA: Insufficient documentation

## 2023-01-27 NOTE — ED Provider Notes (Signed)
[place] EMERGENCY DEPARTMENT AT [facility]  Provider Note  CSN: [id number] Arrival date & time: 01/27/23 2049  History Chief Complaint  Patient presents with   Leg Swelling    Frank May is a 62 y.o. male with history of HTN, afib not on blood thinner here for pain control from painful, swollen R ankle. He reports he has had surgery on that foot and has known arthritis. He usually takes Oxycodone/APAP 10/325 multiple per day but he is due a refill tomorrow and has run out. He is mostly interested in pain control tonight, he is scheduled to see his pain doctor tomorrow also.    Home Medications Prior to Admission medications   Medication Sig Start Date End Date Taking? Authorizing Provider  albuterol (PROVENTIL HFA;VENTOLIN HFA) 108 (90 Base) MCG/ACT inhaler Inhale 2 puffs into the lungs every 6 (six) hours as needed for wheezing or shortness of breath.     [provider]  albuterol (PROVENTIL) (2.5 MG/3ML) 0.083% nebulizer solution Take 2.5 mg by nebulization 3 (three) times daily as needed for wheezing or shortness of breath.    [provider]  albuterol (VENTOLIN HFA) 108 (90 Base) MCG/ACT inhaler TAKE 2 PUFFS BY MOUTH EVERY 6 HOURS AS NEEDED FOR WHEEZE OR SHORTNESS OF BREATH 05/17/22   Icard, Rachel Bo, DO  allopurinol (ZYLOPRIM) 100 MG tablet Take 100 mg by mouth daily.    [provider]  aspirin EC 81 MG tablet Take 81 mg by mouth daily.    [provider]  budesonide-formoterol (SYMBICORT) 160-4.5 MCG/ACT inhaler Inhale 2 puffs into the lungs 2 (two) times daily. 07/28/18 07/28/19  [provider]  cetirizine (ZYRTEC) 10 MG tablet Take 10 mg by mouth daily.    [provider]  diclofenac (VOLTAREN) 75 MG EC tablet Take 75 mg by mouth 2 (two) times daily.    [provider]  docusate sodium (COLACE) 100 MG capsule Take 1 capsule (100 mg total) by mouth 2 (two) times daily. While taking narcotic pain  medicine. 10/29/16   Jacinta Shoe, PA-C  DOXYCYCLINE PO Take 100 mg by mouth.    [provider]  Fluticasone-Umeclidin-Vilant (TRELEGY ELLIPTA) 100-62.5-25 MCG/ACT AEPB Inhale 1 puff into the lungs daily. 05/07/21   [name], DO  Fluticasone-Umeclidin-Vilant (TRELEGY ELLIPTA) 200-62.5-25 MCG/ACT AEPB Inhale 1 puff into the lungs daily. 05/07/21   [name], DO  Fluticasone-Umeclidin-Vilant (TRELEGY ELLIPTA) 200-62.5-25 MCG/ACT AEPB Inhale 1 puff into the lungs daily. 05/30/21   Icard, Rachel Bo, DO  HYDROcodone-acetaminophen (NORCO) 10-325 MG tablet Take 1 tablet by mouth every 6 (six) hours as needed (for pain).    [provider]  lisinopril (PRINIVIL,ZESTRIL) 10 MG tablet Take 10 mg by mouth daily.     [provider]  loperamide (IMODIUM) 2 MG capsule Take 2 mg by mouth 4 (four) times daily as needed for diarrhea or loose stools.    [provider]  metoprolol tartrate (LOPRESSOR) 25 MG tablet Take 1 tablet (25 mg total) by mouth 2 (two) times daily. 08/10/18   [name], MD  naloxone [facility]) nasal spray 4 mg/0.1 mL Place 1 spray into the nose daily as needed.    [provider]  omeprazole (PRILOSEC) 20 MG capsule Take 20 mg by mouth daily. 07/28/18   [provider]  predniSONE (DELTASONE) 20 MG tablet Take 2 tablets (40 mg total) by mouth daily. 06/30/21   [name], PA-C  pregabalin (LYRICA) 75  MG capsule Take 75 mg by mouth 3 (three) times daily. 07/11/18   [provider]  ranitidine (ZANTAC) 150 MG tablet Take 150 mg by mouth daily.    [provider]  senna (SENOKOT) 8.6 MG TABS tablet Take 2 tablets (17.2 mg total) by mouth 2 (two) times daily. 10/29/16   Jacinta Shoe, PA-C  tadalafil (CIALIS) 10 MG tablet Take 10 mg by mouth daily as needed for erectile dysfunction.    [provider]  [name] 100-62.5-25 MCG/ACT AEPB TAKE 1 PUFF BY MOUTH EVERY DAY 10/18/22    Icard, Rachel Bo, DO     Allergies    Patient has no known allergies.   Review of Systems   Review of Systems Please see HPI for pertinent positives and negatives  Physical Exam BP (!) 166/81   Pulse (!) 52   Temp 98.1 F (36.7 C)   Resp 18   Ht 5\' 9"  (1.753 m)   Wt (!) 141.1 kg   SpO2 98%   BMI 45.93 kg/m   Physical Exam Vitals and nursing note reviewed.  HENT:     Head: Normocephalic.     Nose: Nose normal.  Eyes:     Extraocular Movements: Extraocular movements intact.  Pulmonary:     Effort: Pulmonary effort is normal.  Musculoskeletal:        General: Swelling and tenderness present. Normal range of motion.     Cervical back: Neck supple.     Right lower leg: Edema present.  Skin:    Findings: No rash (on exposed skin).  Neurological:     Mental Status: He is alert and oriented to person, place, and time.  Psychiatric:        Mood and Affect: Mood normal.     ED Results / Procedures / Treatments   EKG None  Procedures Procedures  Medications Ordered in the ED Medications - No data to display  Initial Impression and Plan  Patient here with pain and swelling in BLE, R>L, I offered workup to rule out significant causes such as renal, liver, heart disease or DVT but he declines. He drove himself here and understands we cannot give him any sedating medications if he cannot find a ride home. He was offered ACE wrap and will agree to that. Advised to follow up with his doctors tomorrow as scheduled, RTED if he has any other immediate concerns.   ED Course       MDM Rules/Calculators/A&P Medical Decision Making Problems Addressed: Right ankle swelling: chronic illness or injury with exacerbation, progression, or side effects of treatment     Final Clinical Impression(s) / ED Diagnoses Final diagnoses:  Right ankle swelling    Rx / DC Orders ED Discharge Orders     None        Pollyann Savoy, MD 01/27/23 2348

## 2023-01-27 NOTE — ED Notes (Signed)
ACE wrap applied. Will follow up with PCP in AM. Pt verbalized understanding of d/c instructions, meds, and followup care. Denies questions. VSS, no distress noted. Steady gait to exit with all belongings.

## 2023-01-27 NOTE — ED Triage Notes (Signed)
Pt presents with bilateral lower leg swelling onset yesterday, mainly here for pain relief, states he is seeing his PMD tomorrow.

## 2023-03-13 ENCOUNTER — Other Ambulatory Visit: Payer: Self-pay | Admitting: Pulmonary Disease

## 2023-04-12 ENCOUNTER — Other Ambulatory Visit: Payer: Self-pay | Admitting: Pulmonary Disease

## 2023-04-16 ENCOUNTER — Other Ambulatory Visit: Payer: Self-pay

## 2023-04-16 ENCOUNTER — Emergency Department (HOSPITAL_BASED_OUTPATIENT_CLINIC_OR_DEPARTMENT_OTHER): Payer: Medicare HMO | Admitting: Radiology

## 2023-04-16 ENCOUNTER — Emergency Department (HOSPITAL_BASED_OUTPATIENT_CLINIC_OR_DEPARTMENT_OTHER)
Admission: EM | Admit: 2023-04-16 | Discharge: 2023-04-16 | Disposition: A | Discharge status: Home / Self Care | Payer: Medicare HMO

## 2023-04-16 DIAGNOSIS — Z7982 Long term (current) use of aspirin: Secondary | ICD-10-CM | POA: Insufficient documentation

## 2023-04-16 DIAGNOSIS — M25551 Pain in right hip: Secondary | ICD-10-CM | POA: Insufficient documentation

## 2023-04-16 DIAGNOSIS — R42 Dizziness and giddiness: Secondary | ICD-10-CM | POA: Insufficient documentation

## 2023-04-16 LAB — CBC
HCT: 37.7 % — ABNORMAL LOW (ref 39.0–52.0)
Hemoglobin: 12.2 g/dL — ABNORMAL LOW (ref 13.0–17.0)
MCH: 24 pg — ABNORMAL LOW (ref 26.0–34.0)
MCHC: 32.4 g/dL (ref 30.0–36.0)
MCV: 74.2 fL — ABNORMAL LOW (ref 80.0–100.0)
Platelets: 167 10*3/uL (ref 150–400)
RBC: 5.08 MIL/uL (ref 4.22–5.81)
RDW: 15.4 % (ref 11.5–15.5)
WBC: 5.3 10*3/uL (ref 4.0–10.5)
nRBC: 0 % (ref 0.0–0.2)

## 2023-04-16 LAB — URINALYSIS, ROUTINE W REFLEX MICROSCOPIC
Bacteria, UA: NONE SEEN
Bilirubin Urine: NEGATIVE
Glucose, UA: NEGATIVE mg/dL
Hgb urine dipstick: NEGATIVE
Ketones, ur: NEGATIVE mg/dL
Leukocytes,Ua: NEGATIVE
Nitrite: NEGATIVE
Protein, ur: 30 mg/dL — AB
Specific Gravity, Urine: 1.031 — ABNORMAL HIGH (ref 1.005–1.030)
pH: 6 (ref 5.0–8.0)

## 2023-04-16 LAB — BASIC METABOLIC PANEL
Anion gap: 3 — ABNORMAL LOW (ref 5–15)
BUN: 14 mg/dL (ref 8–23)
CO2: 30 mmol/L (ref 22–32)
Calcium: 9 mg/dL (ref 8.9–10.3)
Chloride: 103 mmol/L (ref 98–111)
Creatinine, Ser: 0.96 mg/dL (ref 0.61–1.24)
GFR, Estimated: >60 mL/min (ref >60)
Glucose, Bld: 100 mg/dL — ABNORMAL HIGH (ref 70–99)
Potassium: 3.9 mmol/L (ref 3.5–5.1)
Sodium: 136 mmol/L (ref 135–145)

## 2023-04-16 MED ORDER — MECLIZINE HCL 25 MG PO TABS
25.0000 mg | ORAL_TABLET | Freq: Once | ORAL | Status: AC
  Administered 2023-04-16: 25 mg via ORAL
  Filled 2023-04-16: qty 1

## 2023-04-16 NOTE — ED Provider Notes (Signed)
[place] EMERGENCY DEPARTMENT AT [facility] Provider Note   CSN: [id number] Arrival date & time: 04/16/23  1723     History  Chief Complaint  Patient presents with   Dizziness    Frank May is a 62 y.o. male.  Presenting for concerns of elevated blood pressure.  Had what sounds like peripheral vertigo onset 4 days ago and has been gradually improving and reportedly is essentially gone per patient.  Worsens with head movement, lasted for seconds and resolved spontaneously.  When symptoms started he noted his blood pressure was elevated. He also complaining of atraumatic right hip pain.  No fevers no chills.  Able to ambulate.   Dizziness      Home Medications Prior to Admission medications   Medication Sig Start Date End Date Taking? Authorizing Provider  albuterol (PROVENTIL HFA;VENTOLIN HFA) 108 (90 Base) MCG/ACT inhaler Inhale 2 puffs into the lungs every 6 (six) hours as needed for wheezing May shortness of breath.     [provider]  albuterol (PROVENTIL) (2.5 MG/3ML) 0.083% nebulizer solution Take 2.5 mg by nebulization 3 (three) times daily as needed for wheezing May shortness of breath.    [provider]  albuterol (VENTOLIN HFA) 108 (90 Base) MCG/ACT inhaler TAKE 2 PUFFS BY MOUTH EVERY 6 HOURS AS NEEDED FOR WHEEZE May SHORTNESS OF BREATH 04/12/23   Icard, Rachel Bo, DO  allopurinol (ZYLOPRIM) 100 MG tablet Take 100 mg by mouth daily.    [provider]  aspirin EC 81 MG tablet Take 81 mg by mouth daily.    [provider]  budesonide-formoterol (SYMBICORT) 160-4.5 MCG/ACT inhaler Inhale 2 puffs into the lungs 2 (two) times daily. 07/28/18 07/28/19  [provider]  cetirizine (ZYRTEC) 10 MG tablet Take 10 mg by mouth daily.    [provider]  diclofenac (VOLTAREN) 75 MG EC tablet Take 75 mg by mouth 2 (two) times daily.    [provider]  docusate sodium (COLACE) 100 MG capsule Take 1 capsule  (100 mg total) by mouth 2 (two) times daily. While taking narcotic pain medicine. 10/29/16   Jacinta Shoe, PA-C  DOXYCYCLINE PO Take 100 mg by mouth.    [provider]  Fluticasone-Umeclidin-Vilant (TRELEGY ELLIPTA) 100-62.5-25 MCG/ACT AEPB Inhale 1 puff into the lungs daily. 05/07/21   [name], DO  Fluticasone-Umeclidin-Vilant (TRELEGY ELLIPTA) 200-62.5-25 MCG/ACT AEPB Inhale 1 puff into the lungs daily. 05/07/21   [name], DO  Fluticasone-Umeclidin-Vilant (TRELEGY ELLIPTA) 200-62.5-25 MCG/ACT AEPB Inhale 1 puff into the lungs daily. 05/30/21   Icard, Rachel Bo, DO  HYDROcodone-acetaminophen (NORCO) 10-325 MG tablet Take 1 tablet by mouth every 6 (six) hours as needed (for pain).    [provider]  lisinopril (PRINIVIL,ZESTRIL) 10 MG tablet Take 10 mg by mouth daily.     [provider]  loperamide (IMODIUM) 2 MG capsule Take 2 mg by mouth 4 (four) times daily as needed for diarrhea May loose stools.    [provider]  metoprolol tartrate (LOPRESSOR) 25 MG tablet Take 1 tablet (25 mg total) by mouth 2 (two) times daily. 08/10/18   [name], MD  naloxone [facility]) nasal spray 4 mg/0.1 mL Place 1 spray into the nose daily as needed.    [provider]  omeprazole (PRILOSEC) 20 MG capsule Take 20 mg by mouth daily. 07/28/18   [provider]  predniSONE (DELTASONE) 20 MG tablet Take 2 tablets (40 mg total) by mouth daily.  06/30/21   [name], PA-C  pregabalin (LYRICA) 75 MG capsule Take 75 mg by mouth 3 (three) times daily. 07/11/18   [provider]  ranitidine (ZANTAC) 150 MG tablet Take 150 mg by mouth daily.    [provider]  senna (SENOKOT) 8.6 MG TABS tablet Take 2 tablets (17.2 mg total) by mouth 2 (two) times daily. 10/29/16   Jacinta Shoe, PA-C  tadalafil (CIALIS) 10 MG tablet Take 10 mg by mouth daily as needed for erectile dysfunction.    [provider]  [name] 100-62.5-25 MCG/ACT AEPB INHALE 1 PUFF BY MOUTH EVERY DAY 03/13/23   Icard, Rachel Bo, DO      Allergies    Patient has no known allergies.    Review of Systems   Review of Systems  Neurological:  Positive for dizziness.    Physical Exam Updated Vital Signs BP 120/78   Pulse (!) 58   Temp 97.9 F (36.6 C)   Resp 18   SpO2 99%  Physical Exam Vitals and nursing note reviewed.  Constitutional:      Appearance: He is obese.  HENT:     Head: Normocephalic and atraumatic.     Nose: Nose normal.  Eyes:     Conjunctiva/sclera: Conjunctivae normal.  Cardiovascular:     Rate and Rhythm: Normal rate and regular rhythm.  Pulmonary:     Effort: Pulmonary effort is normal.     Breath sounds: Normal breath sounds.  Abdominal:     General: Abdomen is flat. There is no distension.     Tenderness: There is no abdominal tenderness. There is no guarding May rebound.  Musculoskeletal:        General: Normal range of motion.     Comments: Right hip with no erythema May warmth.  Decreased range of motion of pain.  5 out of 5 plantarflexion and dorsiflexion.  Soft compartments.  Skin:    General: Skin is warm and dry.  Neurological:     General: No focal deficit present.     Mental Status: He is alert.     Cranial Nerves: No cranial nerve deficit.     Sensory: No sensory deficit.     Motor: No weakness.     Coordination: Coordination normal.  Psychiatric:        Mood and Affect: Mood normal.        Behavior: Behavior normal.     ED Results / Procedures / Treatments   Labs (all labs ordered are listed, but only abnormal results are displayed) Labs Reviewed  BASIC METABOLIC PANEL - Abnormal; Notable for the following components:      Result Value   Glucose, Bld 100 (*)    Anion gap 3 (*)    All other components within normal limits  CBC - Abnormal; Notable for the following components:   Hemoglobin 12.2 (*)    HCT 37.7 (*)    MCV 74.2 (*)    MCH 24.0 (*)    All other  components within normal limits  URINALYSIS, ROUTINE W REFLEX MICROSCOPIC - Abnormal; Notable for the following components:   Specific Gravity, Urine 1.031 (*)    Protein, ur 30 (*)    All other components within normal limits    EKG None  Radiology No results found.  Procedures Procedures    Medications Ordered in ED Medications  meclizine (ANTIVERT) tablet 25 mg (25 mg Oral Given 04/16/23 1958)    ED Course/ Medical Decision  Making/ A&P                                 Medical Decision Making 62 year old male presenting emergency department for evaluation of high blood pressure and right hip pain.  Did have some vertigo that started 4 days ago but has essentially resolved per patient report.  He does not want further workup regarding his vertigo.  Also, it sounds as though his vertigo was peripheral.  He is normotensive here.  No leukocytosis to suggest systemic infection.  He has no elevation in his creatinine to suggest endorgan damage from his elevated blood pressure.  UA negative for acute UTI.  X-ray of hip is pending.  On my independent review no overt acute osseous abnormalities noted.  Patient states he would like to leave prior to final radiology read.  Shared decision making regarding doing so.  He states that he will follow-up on MyChart.  He has an Ortho doctor that he would also follow-up with.  Amount and/May Complexity of Data Reviewed External Data Reviewed:     Details: Does have history of A-fib, COPD, gout, and arthritis. Labs: ordered. Radiology: ordered.  Risk Decision regarding hospitalization.          Final Clinical Impression(s) / ED Diagnoses Final diagnoses:  None    Rx / DC Orders ED Discharge Orders     None         Coral Spikes, DO 04/16/23 2029

## 2023-04-16 NOTE — Discharge Instructions (Signed)
As discussed, your hip x-ray is pending.  Please follow-up results on MyChart with your orthopedic doctor as you discussed with me.  Return immediately if develop sudden onset headache, vision changes, facial droop, persistent vertigo, facial droop, unilateral weakness, difficulty finding words or he develop any new or worsening symptoms that are concerning to you.

## 2023-04-16 NOTE — ED Notes (Signed)
Reviewed discharge instructions and home care with pt. Pt verbalized understanding and had no further questions. Pt exited ED without complications.

## 2023-04-16 NOTE — ED Notes (Signed)
Patient transported to X-ray 

## 2023-04-16 NOTE — ED Triage Notes (Addendum)
Stopped HTN meds at least a week ago. Dizzy spell 4 days ago. Started HTN meds again. Dizziness has improved, but continues. Room spinning feeling, no vision changes, happens when he gets out of bed in the morning.  Cialis daily.   Also report right pain. Worse with trying to stand. Ambulatory. No falls.

## 2023-06-18 ENCOUNTER — Other Ambulatory Visit: Payer: Self-pay

## 2023-06-18 ENCOUNTER — Emergency Department (HOSPITAL_BASED_OUTPATIENT_CLINIC_OR_DEPARTMENT_OTHER): Payer: Medicare HMO

## 2023-06-18 ENCOUNTER — Emergency Department (HOSPITAL_BASED_OUTPATIENT_CLINIC_OR_DEPARTMENT_OTHER)
Admission: EM | Admit: 2023-06-18 | Discharge: 2023-06-18 | Disposition: A | Discharge status: Home / Self Care | Payer: Medicare HMO

## 2023-06-18 DIAGNOSIS — Z7951 Long term (current) use of inhaled steroids: Secondary | ICD-10-CM | POA: Insufficient documentation

## 2023-06-18 DIAGNOSIS — S129XXA Fracture of neck, unspecified, initial encounter: Secondary | ICD-10-CM

## 2023-06-18 DIAGNOSIS — Z7982 Long term (current) use of aspirin: Secondary | ICD-10-CM | POA: Diagnosis not present

## 2023-06-18 DIAGNOSIS — I110 Hypertensive heart disease with heart failure: Secondary | ICD-10-CM | POA: Diagnosis not present

## 2023-06-18 DIAGNOSIS — J449 Chronic obstructive pulmonary disease, unspecified: Secondary | ICD-10-CM | POA: Diagnosis not present

## 2023-06-18 DIAGNOSIS — S12200A Unspecified displaced fracture of third cervical vertebra, initial encounter for closed fracture: Secondary | ICD-10-CM | POA: Diagnosis not present

## 2023-06-18 DIAGNOSIS — S199XXA Unspecified injury of neck, initial encounter: Secondary | ICD-10-CM | POA: Diagnosis present

## 2023-06-18 DIAGNOSIS — Z79899 Other long term (current) drug therapy: Secondary | ICD-10-CM | POA: Diagnosis not present

## 2023-06-18 DIAGNOSIS — X501XXA Overexertion from prolonged static or awkward postures, initial encounter: Secondary | ICD-10-CM | POA: Insufficient documentation

## 2023-06-18 DIAGNOSIS — I509 Heart failure, unspecified: Secondary | ICD-10-CM | POA: Insufficient documentation

## 2023-06-18 MED ORDER — OXYCODONE HCL 5 MG PO TABS
5.0000 mg | ORAL_TABLET | Freq: Four times a day (QID) | ORAL | 0 refills | Status: AC | PRN
Start: 2023-06-18 — End: 2023-06-25

## 2023-06-18 MED ORDER — HYDROCODONE-ACETAMINOPHEN 5-325 MG PO TABS
1.0000 | ORAL_TABLET | Freq: Once | ORAL | Status: AC
  Administered 2023-06-18: 1 via ORAL
  Filled 2023-06-18: qty 1

## 2023-06-18 MED ORDER — LIDOCAINE 5 % EX PTCH: 1.0000 | MEDICATED_PATCH | CUTANEOUS | 0 refills | Status: AC

## 2023-06-18 NOTE — ED Provider Notes (Signed)
[place] EMERGENCY DEPARTMENT AT [facility] Provider Note   CSN: [id number] Arrival date & time: 06/18/23  4098     History  Chief Complaint  Patient presents with   Neck Pain    Frank May is a 63 y.o. male.   Neck Pain   63 year old male presents to the emergency department with complaints of neck pain.  Patient describes neck pain worsening this morning upon awakening.  States any movement hurts it.  Denies any known traumatic injury but states that it was "a little sore" for several days prior to acute worsening today.  Patient on chronic pain medication for his foot that he states has not been helping very much with his neck.  Denies any fever, weakness/sensory deficits in upper extremities, known traumatic injury, chest pain, shortness of breath.  This is the pain sometimes radiates to his right elbow but although he is not currently experiencing that sensation.  Past medical history significant for A-fib, GERD, COPD, gout, hypertension who, CHF  Home Medications Prior to Admission medications   Medication Sig Start Date End Date Taking? Authorizing Provider  lidocaine (LIDODERM) 5 % Place 1 patch onto the skin daily. Remove & Discard patch within 12 hours or as directed by MD 06/18/23  Yes Sherian Maroon A, PA  oxyCODONE (ROXICODONE) 5 MG immediate release tablet Take 1 tablet (5 mg total) by mouth every 6 (six) hours as needed for up to 7 days for severe pain (pain score 7-10). 06/18/23 06/25/23 Yes Sherian Maroon A, PA  albuterol (PROVENTIL HFA;VENTOLIN HFA) 108 (90 Base) MCG/ACT inhaler Inhale 2 puffs into the lungs every 6 (six) hours as needed for wheezing or shortness of breath.     [provider]  albuterol (PROVENTIL) (2.5 MG/3ML) 0.083% nebulizer solution Take 2.5 mg by nebulization 3 (three) times daily as needed for wheezing or shortness of breath.    [provider]  albuterol (VENTOLIN HFA) 108 (90 Base) MCG/ACT inhaler TAKE 2  PUFFS BY MOUTH EVERY 6 HOURS AS NEEDED FOR WHEEZE OR SHORTNESS OF BREATH 04/12/23   Icard, Rachel Bo, DO  allopurinol (ZYLOPRIM) 100 MG tablet Take 100 mg by mouth daily.    [provider]  aspirin EC 81 MG tablet Take 81 mg by mouth daily.    [provider]  budesonide-formoterol (SYMBICORT) 160-4.5 MCG/ACT inhaler Inhale 2 puffs into the lungs 2 (two) times daily. 07/28/18 07/28/19  [provider]  cetirizine (ZYRTEC) 10 MG tablet Take 10 mg by mouth daily.    [provider]  diclofenac (VOLTAREN) 75 MG EC tablet Take 75 mg by mouth 2 (two) times daily.    [provider]  docusate sodium (COLACE) 100 MG capsule Take 1 capsule (100 mg total) by mouth 2 (two) times daily. While taking narcotic pain medicine. 10/29/16   Jacinta Shoe, PA-C  DOXYCYCLINE PO Take 100 mg by mouth.    [provider]  Fluticasone-Umeclidin-Vilant (TRELEGY ELLIPTA) 100-62.5-25 MCG/ACT AEPB Inhale 1 puff into the lungs daily. 05/07/21   [name], DO  Fluticasone-Umeclidin-Vilant (TRELEGY ELLIPTA) 200-62.5-25 MCG/ACT AEPB Inhale 1 puff into the lungs daily. 05/07/21   [name], DO  Fluticasone-Umeclidin-Vilant (TRELEGY ELLIPTA) 200-62.5-25 MCG/ACT AEPB Inhale 1 puff into the lungs daily. 05/30/21   Icard, Rachel Bo, DO  HYDROcodone-acetaminophen (NORCO) 10-325 MG tablet Take 1 tablet by mouth every 6 (six) hours as needed (for pain).    [provider]  lisinopril (PRINIVIL,ZESTRIL) 10 MG tablet Take  10 mg by mouth daily.     [provider]  loperamide (IMODIUM) 2 MG capsule Take 2 mg by mouth 4 (four) times daily as needed for diarrhea or loose stools.    [provider]  metoprolol tartrate (LOPRESSOR) 25 MG tablet Take 1 tablet (25 mg total) by mouth 2 (two) times daily. 08/10/18   [name], MD  naloxone [facility]) nasal spray 4 mg/0.1 mL Place 1 spray into the nose daily as needed.    [provider]   omeprazole (PRILOSEC) 20 MG capsule Take 20 mg by mouth daily. 07/28/18   [provider]  predniSONE (DELTASONE) 20 MG tablet Take 2 tablets (40 mg total) by mouth daily. 06/30/21   [name], PA-C  pregabalin (LYRICA) 75 MG capsule Take 75 mg by mouth 3 (three) times daily. 07/11/18   [provider]  ranitidine (ZANTAC) 150 MG tablet Take 150 mg by mouth daily.    [provider]  senna (SENOKOT) 8.6 MG TABS tablet Take 2 tablets (17.2 mg total) by mouth 2 (two) times daily. 10/29/16   Jacinta Shoe, PA-C  tadalafil (CIALIS) 10 MG tablet Take 10 mg by mouth daily as needed for erectile dysfunction.    [provider]  [name] 100-62.5-25 MCG/ACT AEPB INHALE 1 PUFF BY MOUTH EVERY DAY 03/13/23   Icard, Rachel Bo, DO      Allergies    Patient has no known allergies.    Review of Systems   Review of Systems  Musculoskeletal:  Positive for neck pain.  All other systems reviewed and are negative.   Physical Exam Updated Vital Signs BP 139/74   Pulse 88   Temp 98.2 F (36.8 C)   Resp 16   SpO2 98%  Physical Exam Vitals and nursing note reviewed.  Constitutional:      General: He is not in acute distress.    Appearance: He is well-developed.  HENT:     Head: Normocephalic and atraumatic.  Eyes:     Conjunctiva/sclera: Conjunctivae normal.  Neck:     Comments: Patient able to range neck fully but with pain in right side of neck during range of motion exercises.  No midline tenderness of cervical spine with paraspinal tenderness noted around C2-C4 region.  Symmetric 5 out of 5 strength upper extremities with no decrease sensation along major nerve distributions.  Radial pulses 2+ bilaterally. Cardiovascular:     Rate and Rhythm: Normal rate and regular rhythm.     Heart sounds: No murmur heard. Pulmonary:     Effort: Pulmonary effort is normal. No respiratory distress.     Breath sounds: Normal breath sounds.  Abdominal:      Palpations: Abdomen is soft.     Tenderness: There is no abdominal tenderness.  Musculoskeletal:        General: No swelling.     Cervical back: Normal range of motion and neck supple.  Skin:    General: Skin is warm and dry.     Capillary Refill: Capillary refill takes less than 2 seconds.  Neurological:     Mental Status: He is alert.  Psychiatric:        Mood and Affect: Mood normal.     ED Results / Procedures / Treatments   Labs (all labs ordered are listed, but only abnormal results are displayed) Labs Reviewed - No data to display  EKG None  Radiology CT Cervical Spine Wo Contrast Result Date: 06/18/2023 CLINICAL DATA:  Neck pain EXAM: CT CERVICAL SPINE WITHOUT CONTRAST TECHNIQUE: Multidetector CT imaging of the cervical spine was performed without intravenous contrast. Multiplanar CT image reconstructions were also generated. RADIATION DOSE REDUCTION: This exam was performed according to the departmental dose-optimization program which includes automated exposure control, adjustment of the mA and/or kV according to patient size and/or use of iterative reconstruction technique. COMPARISON:  None Available. FINDINGS: Alignment: Normal. Skull base and vertebrae: C3 right transverse process fracture which is nondisplaced. No evidence of bone lesion. This fracture does not involve the transverse foramen. Soft tissues and spinal canal: No prevertebral fluid or swelling. No visible canal hematoma. Disc levels:  Mild spondylitic and degenerative spurring. Upper chest: No acute finding Prelim sent in epic chat. IMPRESSION: Nondisplaced C3 right transverse process fracture which does not involve the transverse foramen. Electronically Signed   By: Tiburcio Pea M.D.   On: 06/18/2023 10:05    Procedures Procedures    Medications Ordered in ED Medications  HYDROcodone-acetaminophen (NORCO/VICODIN) 5-325 MG per tablet 1 tablet (1 tablet Oral Given 06/18/23 1122)    ED Course/ Medical  Decision Making/ A&P                                 Medical Decision Making Amount and/or Complexity of Data Reviewed Radiology: ordered.   This patient presents to the ED for concern of neck pain, this involves an extensive number of treatment options, and is a complaint that carries with it a high risk of complications and morbidity.  The differential diagnosis includes fracture, strain/pain, dislocation, ligamentous/tendinous injury, neurovascular compromise, meningitis, carotid artery/vertebral artery dissection, other   Co morbidities that complicate the patient evaluation  See HPI   Additional history obtained:  Additional history obtained from EMR External records from outside source obtained and reviewed including hospital records   Lab Tests:  N/a   Imaging Studies ordered:  I ordered imaging studies including Ct cervical spine  I independently visualized and interpreted imaging which showed nondisplaced C3 right transverse process fracture with no involvement of transverse foreman I agree with the radiologist interpretation   Cardiac Monitoring: / EKG:  The patient was maintained on a cardiac monitor.  I personally viewed and interpreted the cardiac monitored which showed an underlying rhythm of: see above   Consultations Obtained:  I requested consultation with attending physician Dr. Rhae Hammock who is in agreement with treatment plan going forward  Problem List / ED Course / Critical interventions / Medication management  Transverse process fracture of cervical spine I ordered medication including Norco   Reevaluation of the patient after these medicines showed that the patient improved I have reviewed the patients home medicines and have made adjustments as needed   Social Determinants of Health:  Chronic cigarette use.  Denies illicit drug use.   Test / Admission - Considered:  Transverse process fracture of cervical spine Vitals signs within normal  range and stable throughout visit. Imaging studies significant for: See above 63 year old male presents emergency department with complaints of right-sided neck pain.  Symptoms present for the past several days with acute worsening this morning.  On exam, right-sided paraspinal tenderness noted cervical region as above.  No evidence clinically of meningismus.  No obvious weakness or sensory deficit in upper extremities.  CT imaging obtained by triage staff consistent with right-sided C3 transverse process fracture.  Upon further review, patient did notice about 6 or 7 days ago when he  was working on a car, noticed abrupt twisting of his neck in a certain direction he felt a sharp pain on the right side of his neck but symptoms are somewhat transient.  Denies any other trauma/mechanism of injury otherwise.  Suspect this could have been inciting incident regarding current CT findings of transverse process fracture.  Will recommend rest, ice, and follow-up with neurosurgery in the outpatient setting.  Treatment plan discussed in length with the patient and he acknowledged understanding was agreeable to plan.  Patient overall well-appearing, afebrile in no acute distress. Worrisome signs and symptoms were discussed with the patient, and the patient acknowledged understanding to return to the ED if noticed. Patient was stable upon discharge.          Final Clinical Impression(s) / ED Diagnoses Final diagnoses:  Closed fracture of transverse process of cervical vertebra, initial encounter [facility])    Rx / DC Orders ED Discharge Orders          Ordered    oxyCODONE (ROXICODONE) 5 MG immediate release tablet  Every 6 hours PRN        06/18/23 1121    lidocaine (LIDODERM) 5 %  Every 24 hours        06/18/23 1121              Peter Garter, [place] 06/18/23 1408    Durwin Glaze, MD 06/18/23 1416

## 2023-06-18 NOTE — ED Triage Notes (Signed)
Pt c/o neck pain onset this AM. States he feels like "the muscle in my neck is twisted." Denies known injury, MS issue. No meds PTA

## 2023-06-18 NOTE — Discharge Instructions (Addendum)
As discussed, CT scan did show evidence of fracture of the transverse process of your C3 vertebrae.  Will give you a soft cervical collar to use as needed for symptomatic relief but please do not wear this all the time.  Recommend continued Tylenol/Motrin for your baseline pain with pain medication for breakthrough pain.  Call number attached to discharge papers to schedule appointment with a neurosurgery.  Please not hesitate to return to emergency department if there are worrisome signs and symptoms we discussed become apparent.

## 2023-07-17 ENCOUNTER — Emergency Department (HOSPITAL_BASED_OUTPATIENT_CLINIC_OR_DEPARTMENT_OTHER): Payer: Medicare HMO

## 2023-07-17 ENCOUNTER — Encounter (HOSPITAL_BASED_OUTPATIENT_CLINIC_OR_DEPARTMENT_OTHER): Payer: Self-pay

## 2023-07-17 ENCOUNTER — Emergency Department (HOSPITAL_BASED_OUTPATIENT_CLINIC_OR_DEPARTMENT_OTHER)
Admission: EM | Admit: 2023-07-17 | Discharge: 2023-07-17 | Disposition: A | Discharge status: Home / Self Care | Payer: Medicare HMO | Attending: Emergency Medicine | Admitting: Emergency Medicine

## 2023-07-17 ENCOUNTER — Other Ambulatory Visit: Payer: Self-pay

## 2023-07-17 DIAGNOSIS — Z7982 Long term (current) use of aspirin: Secondary | ICD-10-CM | POA: Diagnosis not present

## 2023-07-17 DIAGNOSIS — I4891 Unspecified atrial fibrillation: Secondary | ICD-10-CM | POA: Diagnosis not present

## 2023-07-17 DIAGNOSIS — I509 Heart failure, unspecified: Secondary | ICD-10-CM | POA: Diagnosis not present

## 2023-07-17 DIAGNOSIS — J449 Chronic obstructive pulmonary disease, unspecified: Secondary | ICD-10-CM | POA: Insufficient documentation

## 2023-07-17 DIAGNOSIS — F17210 Nicotine dependence, cigarettes, uncomplicated: Secondary | ICD-10-CM | POA: Insufficient documentation

## 2023-07-17 DIAGNOSIS — J101 Influenza due to other identified influenza virus with other respiratory manifestations: Secondary | ICD-10-CM | POA: Insufficient documentation

## 2023-07-17 DIAGNOSIS — R0602 Shortness of breath: Secondary | ICD-10-CM | POA: Diagnosis present

## 2023-07-17 LAB — CBC WITH DIFFERENTIAL/PLATELET
Abs Immature Granulocytes: 0.02 10*3/uL (ref 0.00–0.07)
Basophils Absolute: 0 10*3/uL (ref 0.0–0.1)
Basophils Relative: 0 %
Eosinophils Absolute: 0 10*3/uL (ref 0.0–0.5)
Eosinophils Relative: 0 %
HCT: 37.3 % — ABNORMAL LOW (ref 39.0–52.0)
Hemoglobin: 12.1 g/dL — ABNORMAL LOW (ref 13.0–17.0)
Immature Granulocytes: 0 %
Lymphocytes Relative: 15 %
Lymphs Abs: 0.8 10*3/uL (ref 0.7–4.0)
MCH: 24.3 pg — ABNORMAL LOW (ref 26.0–34.0)
MCHC: 32.4 g/dL (ref 30.0–36.0)
MCV: 74.9 fL — ABNORMAL LOW (ref 80.0–100.0)
Monocytes Absolute: 0.4 10*3/uL (ref 0.1–1.0)
Monocytes Relative: 8 %
Neutro Abs: 4 10*3/uL (ref 1.7–7.7)
Neutrophils Relative %: 77 %
Platelets: 130 10*3/uL — ABNORMAL LOW (ref 150–400)
RBC: 4.98 MIL/uL (ref 4.22–5.81)
RDW: 16.6 % — ABNORMAL HIGH (ref 11.5–15.5)
WBC: 5.3 10*3/uL (ref 4.0–10.5)
nRBC: 0 % (ref 0.0–0.2)

## 2023-07-17 LAB — COMPREHENSIVE METABOLIC PANEL
ALT: 35 U/L (ref 0–44)
AST: 77 U/L — ABNORMAL HIGH (ref 15–41)
Albumin: 3.7 g/dL (ref 3.5–5.0)
Alkaline Phosphatase: 38 U/L (ref 38–126)
Anion gap: 8 (ref 5–15)
BUN: 11 mg/dL (ref 8–23)
CO2: 23 mmol/L (ref 22–32)
Calcium: 8.5 mg/dL — ABNORMAL LOW (ref 8.9–10.3)
Chloride: 103 mmol/L (ref 98–111)
Creatinine, Ser: 0.84 mg/dL (ref 0.61–1.24)
GFR, Estimated: >60 mL/min (ref >60)
Glucose, Bld: 109 mg/dL — ABNORMAL HIGH (ref 70–99)
Potassium: 3.6 mmol/L (ref 3.5–5.1)
Sodium: 134 mmol/L — ABNORMAL LOW (ref 135–145)
Total Bilirubin: 0.6 mg/dL (ref 0.0–1.2)
Total Protein: 8.6 g/dL — ABNORMAL HIGH (ref 6.5–8.1)

## 2023-07-17 LAB — BRAIN NATRIURETIC PEPTIDE: B Natriuretic Peptide: 188.7 pg/mL — ABNORMAL HIGH (ref 0.0–100.0)

## 2023-07-17 LAB — RESP PANEL BY RT-PCR (RSV, FLU A&B, COVID)  RVPGX2
Influenza A by PCR: POSITIVE — AB
Influenza B by PCR: NEGATIVE
Resp Syncytial Virus by PCR: NEGATIVE
SARS Coronavirus 2 by RT PCR: NEGATIVE

## 2023-07-17 MED ORDER — IPRATROPIUM-ALBUTEROL 0.5-2.5 (3) MG/3ML IN SOLN
RESPIRATORY_TRACT | Status: AC
  Administered 2023-07-17: 3 mL via RESPIRATORY_TRACT
  Filled 2023-07-17: qty 9

## 2023-07-17 MED ORDER — PREDNISONE 50 MG PO TABS
60.0000 mg | ORAL_TABLET | Freq: Once | ORAL | Status: AC
  Administered 2023-07-17: 60 mg via ORAL
  Filled 2023-07-17: qty 1

## 2023-07-17 MED ORDER — IPRATROPIUM-ALBUTEROL 0.5-2.5 (3) MG/3ML IN SOLN
3.0000 mL | RESPIRATORY_TRACT | Status: AC
  Administered 2023-07-17 (×2): 3 mL via RESPIRATORY_TRACT

## 2023-07-17 MED ORDER — PREDNISONE 10 MG PO TABS: 40.0000 mg | ORAL_TABLET | Freq: Every day | ORAL | 0 refills | Status: AC

## 2023-07-17 MED ORDER — APIXABAN 2.5 MG PO TABS
5.0000 mg | ORAL_TABLET | Freq: Two times a day (BID) | ORAL | Status: DC
  Administered 2023-07-17: 5 mg via ORAL
  Filled 2023-07-17: qty 2

## 2023-07-17 MED ORDER — ACETAMINOPHEN 500 MG PO TABS: 500.0000 mg | ORAL_TABLET | Freq: Four times a day (QID) | ORAL | 0 refills | Status: AC | PRN

## 2023-07-17 MED ORDER — ACETAMINOPHEN 325 MG PO TABS
650.0000 mg | ORAL_TABLET | Freq: Once | ORAL | Status: AC
  Administered 2023-07-17: 650 mg via ORAL
  Filled 2023-07-17: qty 2

## 2023-07-17 MED ORDER — IBUPROFEN 800 MG PO TABS
800.0000 mg | ORAL_TABLET | Freq: Once | ORAL | Status: AC
  Administered 2023-07-17: 800 mg via ORAL
  Filled 2023-07-17: qty 1

## 2023-07-17 MED ORDER — OSELTAMIVIR PHOSPHATE 75 MG PO CAPS: 75.0000 mg | ORAL_CAPSULE | Freq: Two times a day (BID) | ORAL | 0 refills | Status: AC

## 2023-07-17 MED ORDER — ONDANSETRON HCL 4 MG PO TABS: 4.0000 mg | ORAL_TABLET | Freq: Three times a day (TID) | ORAL | 0 refills | Status: AC | PRN

## 2023-07-17 NOTE — ED Provider Notes (Signed)
  Physical Exam  BP 131/70 (BP Location: Left Arm)   Pulse (!) 110   Temp 100.2 F (37.9 C) (Oral)   Resp 16   Ht 5\' 10"  (1.778 m)   Wt (!) 142.4 kg   SpO2 92%   BMI 45.05 kg/m   Physical Exam  Procedures  Procedures  ED Course / MDM    Medical Decision Making Amount and/or Complexity of Data Reviewed Labs: ordered. Radiology: ordered.  Risk OTC drugs. Prescription drug management.   Assuming care of patient from Dr. Clayborne Dana. Patient has history of COPD, CHF comes in with chief complaint of shortness of breath for about 3 days with fevers, URI.   Workup is pending.  Patient was found to be tachycardic.  X-ray of the chest is concerning for viral illness.  Clinically more so bronchiolitis than pulmonary edema.  Labs are pending.  Reassessment at 7:30 AM: Patient asleep.  O2 sats at rest 92%.  Patient is not wheezing.  He is sweating, likely because the fever is breaking.  He states that he feels better.  Plan is to observe him little bit longer.  Reassessment at 10 AM: Patient continues to feel all right. Discussed with him his A-fib.  He is comfortable going home. We did have him do ambulatory pulse ox, O2 sats remained 95% or above.  A-fib clinic follow-up has been provided. I am hesitant that starting him on any beta-blocker or diltiazem at this time.  Advised that he come back to the ER if he starts having chest pain, shortness of breath, dizziness.  We will start him on Eliquis.  CHA2DS2-VASc Score =  2     Derwood Kaplan, MD 07/17/23 1021

## 2023-07-17 NOTE — ED Triage Notes (Signed)
Pt POV from home d/t SOB for 3 days.  Pt tried inhaler and Neb at home but did not help.

## 2023-07-17 NOTE — ED Notes (Signed)
Pt ambulated on RA in the room for a few minutes, no desaturation. Sats remained 95%, HR 110-115. No distress or SOB noted.

## 2023-07-17 NOTE — ED Provider Notes (Signed)
[place] EMERGENCY DEPARTMENT AT [facility] Provider Note   CSN: [id number] Arrival date & time: 07/17/23  4098     History {Add pertinent medical, surgical, social history, OB history to HPI:1} Chief Complaint  Patient presents with   Shortness of Breath    Frank May is a 63 y.o. male.  63 year old male who presents ER today with dyspnea.  Patient states he is felt bad for the last few days.  His wife had the flu or similar type of viral illness and his son in the house was sick as well.  Patient states he tried his breathing treatments at home and getting better.  He states he does smoke cigarettes.  Has felt bad but has not noticeably had a fever or not.   Shortness of Breath      Home Medications Prior to Admission medications   Medication Sig Start Date End Date Taking? Authorizing Provider  albuterol (PROVENTIL HFA;VENTOLIN HFA) 108 (90 Base) MCG/ACT inhaler Inhale 2 puffs into the lungs every 6 (six) hours as needed for wheezing or shortness of breath.     [provider]  albuterol (PROVENTIL) (2.5 MG/3ML) 0.083% nebulizer solution Take 2.5 mg by nebulization 3 (three) times daily as needed for wheezing or shortness of breath.    [provider]  albuterol (VENTOLIN HFA) 108 (90 Base) MCG/ACT inhaler TAKE 2 PUFFS BY MOUTH EVERY 6 HOURS AS NEEDED FOR WHEEZE OR SHORTNESS OF BREATH 04/12/23   Icard, Rachel Bo, DO  allopurinol (ZYLOPRIM) 100 MG tablet Take 100 mg by mouth daily.    [provider]  aspirin EC 81 MG tablet Take 81 mg by mouth daily.    [provider]  budesonide-formoterol (SYMBICORT) 160-4.5 MCG/ACT inhaler Inhale 2 puffs into the lungs 2 (two) times daily. 07/28/18 07/28/19  [provider]  cetirizine (ZYRTEC) 10 MG tablet Take 10 mg by mouth daily.    [provider]  diclofenac (VOLTAREN) 75 MG EC tablet Take 75 mg by mouth 2 (two) times daily.    [provider]  docusate  sodium (COLACE) 100 MG capsule Take 1 capsule (100 mg total) by mouth 2 (two) times daily. While taking narcotic pain medicine. 10/29/16   Jacinta Shoe, PA-C  DOXYCYCLINE PO Take 100 mg by mouth.    [provider]  Fluticasone-Umeclidin-Vilant (TRELEGY ELLIPTA) 100-62.5-25 MCG/ACT AEPB Inhale 1 puff into the lungs daily. 05/07/21   [name], DO  Fluticasone-Umeclidin-Vilant (TRELEGY ELLIPTA) 200-62.5-25 MCG/ACT AEPB Inhale 1 puff into the lungs daily. 05/07/21   [name], DO  Fluticasone-Umeclidin-Vilant (TRELEGY ELLIPTA) 200-62.5-25 MCG/ACT AEPB Inhale 1 puff into the lungs daily. 05/30/21   Icard, Rachel Bo, DO  HYDROcodone-acetaminophen (NORCO) 10-325 MG tablet Take 1 tablet by mouth every 6 (six) hours as needed (for pain).    [provider]  lidocaine (LIDODERM) 5 % Place 1 patch onto the skin daily. Remove & Discard patch within 12 hours or as directed by MD 06/18/23   Sherian Maroon A, PA  lisinopril (PRINIVIL,ZESTRIL) 10 MG tablet Take 10 mg by mouth daily.     [provider]  loperamide (IMODIUM) 2 MG capsule Take 2 mg by mouth 4 (four) times daily as needed for diarrhea or loose stools.    [provider]  metoprolol tartrate (LOPRESSOR) 25 MG tablet Take 1 tablet (25 mg total) by mouth 2 (two) times daily. 08/10/18   [name], MD  naloxone [facility]) nasal spray 4  mg/0.1 mL Place 1 spray into the nose daily as needed.    [provider]  omeprazole (PRILOSEC) 20 MG capsule Take 20 mg by mouth daily. 07/28/18   [provider]  predniSONE (DELTASONE) 20 MG tablet Take 2 tablets (40 mg total) by mouth daily. 06/30/21   [name], PA-C  pregabalin (LYRICA) 75 MG capsule Take 75 mg by mouth 3 (three) times daily. 07/11/18   [provider]  ranitidine (ZANTAC) 150 MG tablet Take 150 mg by mouth daily.    [provider]  senna (SENOKOT) 8.6 MG TABS tablet Take 2 tablets (17.2 mg total) by  mouth 2 (two) times daily. 10/29/16   Jacinta Shoe, PA-C  tadalafil (CIALIS) 10 MG tablet Take 10 mg by mouth daily as needed for erectile dysfunction.    [provider]  [name] 100-62.5-25 MCG/ACT AEPB INHALE 1 PUFF BY MOUTH EVERY DAY 03/13/23   Icard, Rachel Bo, DO      Allergies    Allopurinol    Review of Systems   Review of Systems  Respiratory:  Positive for shortness of breath.     Physical Exam Updated Vital Signs BP 135/78   Pulse (!) 110   Temp 100.2 F (37.9 C) (Oral)   Resp 18   Ht 5\' 10"  (1.778 m)   Wt (!) 142.4 kg   SpO2 93%   BMI 45.05 kg/m  Physical Exam Vitals and nursing note reviewed.  Constitutional:      General: He is not in acute distress.    Appearance: He is well-developed. He is ill-appearing.  HENT:     Head: Normocephalic and atraumatic.  Cardiovascular:     Rate and Rhythm: Tachycardia present. Rhythm irregular.  Pulmonary:     Effort: Tachypnea and accessory muscle usage present. No respiratory distress.     Breath sounds: Decreased breath sounds and wheezing present.  Abdominal:     General: There is no distension.  Musculoskeletal:        General: Normal range of motion.     Cervical back: Normal range of motion.  Skin:    General: Skin is warm and dry.  Neurological:     Mental Status: He is alert.     ED Results / Procedures / Treatments   Labs (all labs ordered are listed, but only abnormal results are displayed) Labs Reviewed  CBC WITH DIFFERENTIAL/PLATELET - Abnormal; Notable for the following components:      Result Value   Hemoglobin 12.1 (*)    HCT 37.3 (*)    MCV 74.9 (*)    MCH 24.3 (*)    RDW 16.6 (*)    Platelets 130 (*)    All other components within normal limits  COMPREHENSIVE METABOLIC PANEL - Abnormal; Notable for the following components:   Sodium 134 (*)    Glucose, Bld 109 (*)    Calcium 8.5 (*)    Total Protein 8.6 (*)    AST 77 (*)    All other components within normal  limits  RESP PANEL BY RT-PCR (RSV, FLU A&B, COVID)  RVPGX2  BRAIN NATRIURETIC PEPTIDE    EKG EKG Interpretation Date/Time:  Saturday July 17 2023 05:59:56 EST Ventricular Rate:  96 PR Interval:    QRS Duration:  84 QT Interval:  353 QTC Calculation: 447 R Axis:   250  Text Interpretation: Atrial fibrillation Left anterior fascicular block Abnormal R-wave progression, late transition Interpretation limited secondary to artifact Confirmed by Dwyane Dupree,  Barbara Cower [phone]) on 07/17/2023 6:43:35 AM  Radiology DG Chest Portable 1 View Result Date: 07/17/2023 CLINICAL DATA:  63 year old male with history of shortness of breath. Evaluate for pneumonia. EXAM: PORTABLE CHEST 1 VIEW COMPARISON:  Chest x-ray 06/30/2021. FINDINGS: Lung volumes are slightly low. Mild elevation of the right hemidiaphragm. Mild diffuse interstitial prominence and peribronchial cuffing. No confluent consolidative airspace disease. No definite pleural effusions. No pneumothorax. Mild cephalization of the pulmonary vasculature. Mild cardiomegaly. Upper mediastinal contours are within normal limits. IMPRESSION: 1. The appearance the chest is most suggestive of mild congestive heart failure, as above. Electronically Signed   By: Trudie Reed M.D.   On: 07/17/2023 06:29    Procedures Procedures    Medications Ordered in ED Medications  ibuprofen (ADVIL) tablet 800 mg (has no administration in time range)  acetaminophen (TYLENOL) tablet 650 mg (650 mg Oral Given 07/17/23 0552)  ipratropium-albuterol (DUONEB) 0.5-2.5 (3) MG/3ML nebulizer solution 3 mL (3 mLs Nebulization Given 07/17/23 0559)  predniSONE (DELTASONE) tablet 60 mg (60 mg Oral Given 07/17/23 0605)    ED Course/ Medical Decision Making/ A&P                                 Medical Decision Making Amount and/or Complexity of Data Reviewed Labs: ordered. Radiology: ordered.  Risk OTC drugs. Prescription drug management.  Will hold on sepsis activation as  likely a viral/flu like illness. Also with diminished breath sounds c/w bronchitis. Will give steroids/duonebs.   Afib new from most recent. Not on anticoagulation. Has had it in the distant past it appears. Will initiate.   CHA2DS2/VAS Stroke Risk Points  Current as of 2 minutes ago     2 >= 2 Points: High Risk  1 to 1.99 Points: Medium Risk  0 Points: Low Risk    No Change      Details    This score determines the patient's risk of having a stroke if the  patient has atrial fibrillation.       Points Metrics  1 Has Congestive Heart Failure:  Yes    Current as of 2 minutes ago  0 Has Vascular Disease:  No    Current as of 2 minutes ago  1 Has Hypertension:  Yes    Current as of 2 minutes ago  0 Age:  59    Current as of 2 minutes ago  0 Has Diabetes Excluding Gestational Diabetes:  No    Current as of 2 minutes ago  0 Had Stroke:  No  Had TIA:  No  Had Thromboembolism:  No    Current as of 2 minutes ago  0 Male:  No    Current as of 2 minutes ago        ***   Final Clinical Impression(s) / ED Diagnoses Final diagnoses:  None    Rx / DC Orders ED Discharge Orders     None

## 2023-07-17 NOTE — Discharge Instructions (Addendum)
The workup in the emergency room reveals that you have flu.  The treatment for which is symptomatic relief only, and your body will fight the infection off in a few days. Does appear that you likely have asthma exacerbation as well, therefore we are prescribing you prednisone.  Additionally, you are found to be in atrial fibrillation.  Please follow-up with cardiology service for it.  We have put in a referral to the A-fib clinic.  Please take the medications that are prescribed. We are prescribing you Tamiflu, as it has shown some ability to reduce the length of your symptoms.  It is okay if you do not choose to take it because of severe side effects.  Please return to the ER if your symptoms worsen; you have increased pain, fevers, chills, worsening breathing, severe nausea or vomiting ,inability to keep any medications down, confusion.

## 2023-08-02 ENCOUNTER — Ambulatory Visit (HOSPITAL_COMMUNITY): Payer: Medicare HMO | Admitting: Internal Medicine

## 2023-11-08 IMAGING — CT NM PET TUM IMG INITIAL (PI) SKULL BASE T - THIGH
1 of 7 series · 3 of 25 positions shown · non-contrast
Comparison: CT abdomen/pelvis dated 07/18/2018

CLINICAL DATA: Initial treatment strategy for pulmonary nodule.

EXAM:
NUCLEAR MEDICINE PET SKULL BASE TO THIGH
TECHNIQUE: 16.0 mCi F-18 FDG was injected intravenously. Full-ring PET imaging
was performed from the skull base to thigh after the radiotracer. CT
data was obtained and used for attenuation correction and anatomic
localization.
Fasting blood glucose: 80 mg/dl

[Series 4: ct sk_thigh 5.0 bf37 · axial · 5.0mm · 0.98mm/px · z∈[-1225,-741]mm · 3 of 243 slices shown]
[im 61/243  brain]
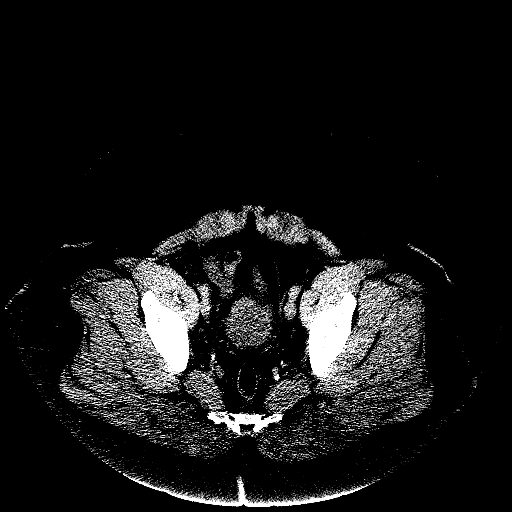
[im 122/243  brain]
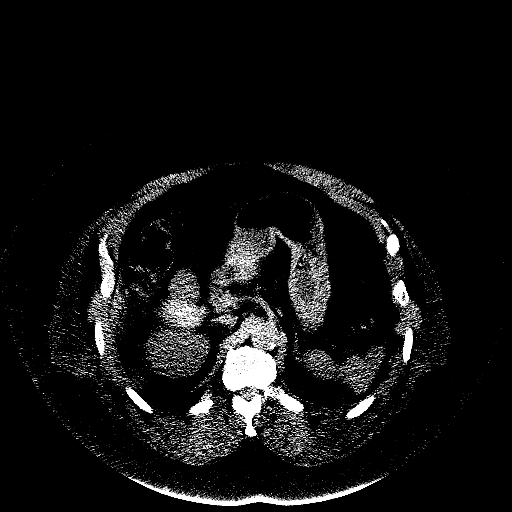
[im 182/243  brain]
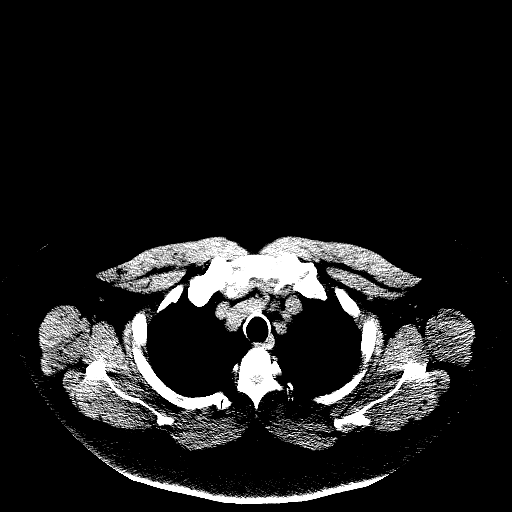

[3 of 25 positions shown; findings below may reference images not displayed]

FINDINGS: Mediastinal blood pool activity: SUV max

Liver activity: SUV max NA

NECK: No hypermetabolic cervical hypermetabolism.

Diffuse hypermetabolism involving the bilateral parotid glands,
favored to be reactive.

Incidental CT findings: none

CHEST: No hypermetabolic pulmonary nodules.

Chronic scarring in the medial left lower lobe, non FDG avid, likely
post infectious/inflammatory.

No hypermetabolic thoracic lymphadenopathy.

Incidental CT findings: Mild atherosclerotic calcifications of the
aortic arch.

ABDOMEN/PELVIS: No abnormal hypermetabolism in the liver, spleen,
pancreas, or adrenal glands.

No hypermetabolic abdominopelvic lymphadenopathy.

Incidental CT findings: 12 mm coarse parenchymal calcification along
the left lower kidney, chronic. Mild atherosclerotic calcifications
the abdominal aorta and branch vessels.

SKELETON: No focal hypermetabolic activity to suggest skeletal
metastasis.

Incidental CT findings: Degenerative changes of the visualized
thoracolumbar spine.
IMPRESSION: No findings suspicious for malignancy.

Chronic scarring in the medial left lower lobe, likely post
infectious/inflammatory.

## 2023-12-23 IMAGING — DX DG CHEST 2V
2 series · 2 of 2 positions shown · non-contrast
Comparison: PET-CT 05/16/2021.  Chest radiographs 12/17/2019.

CLINICAL DATA: Shortness of breath, bilateral rib pain.

EXAM:
CHEST - 2 VIEW

[chest pa]
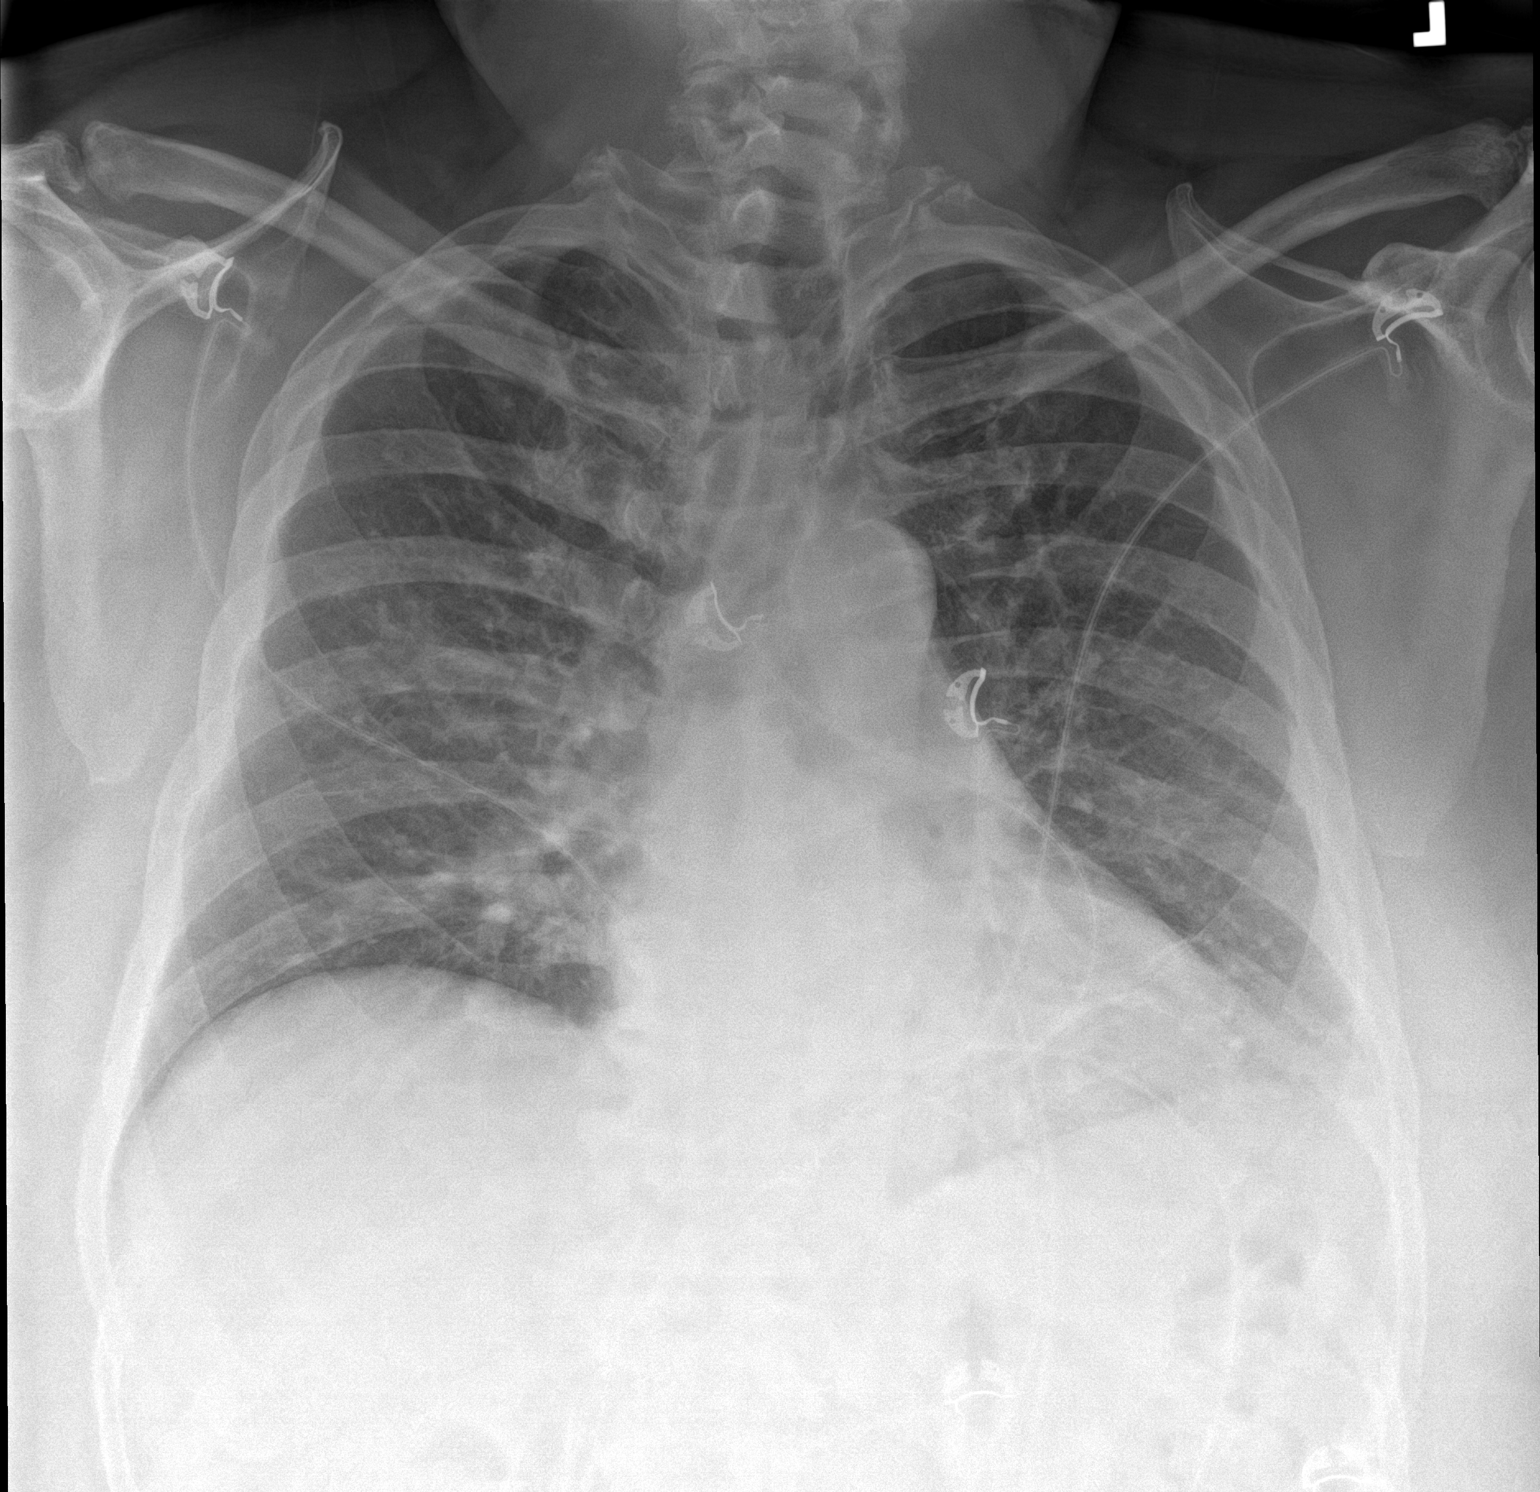

[chest lat]
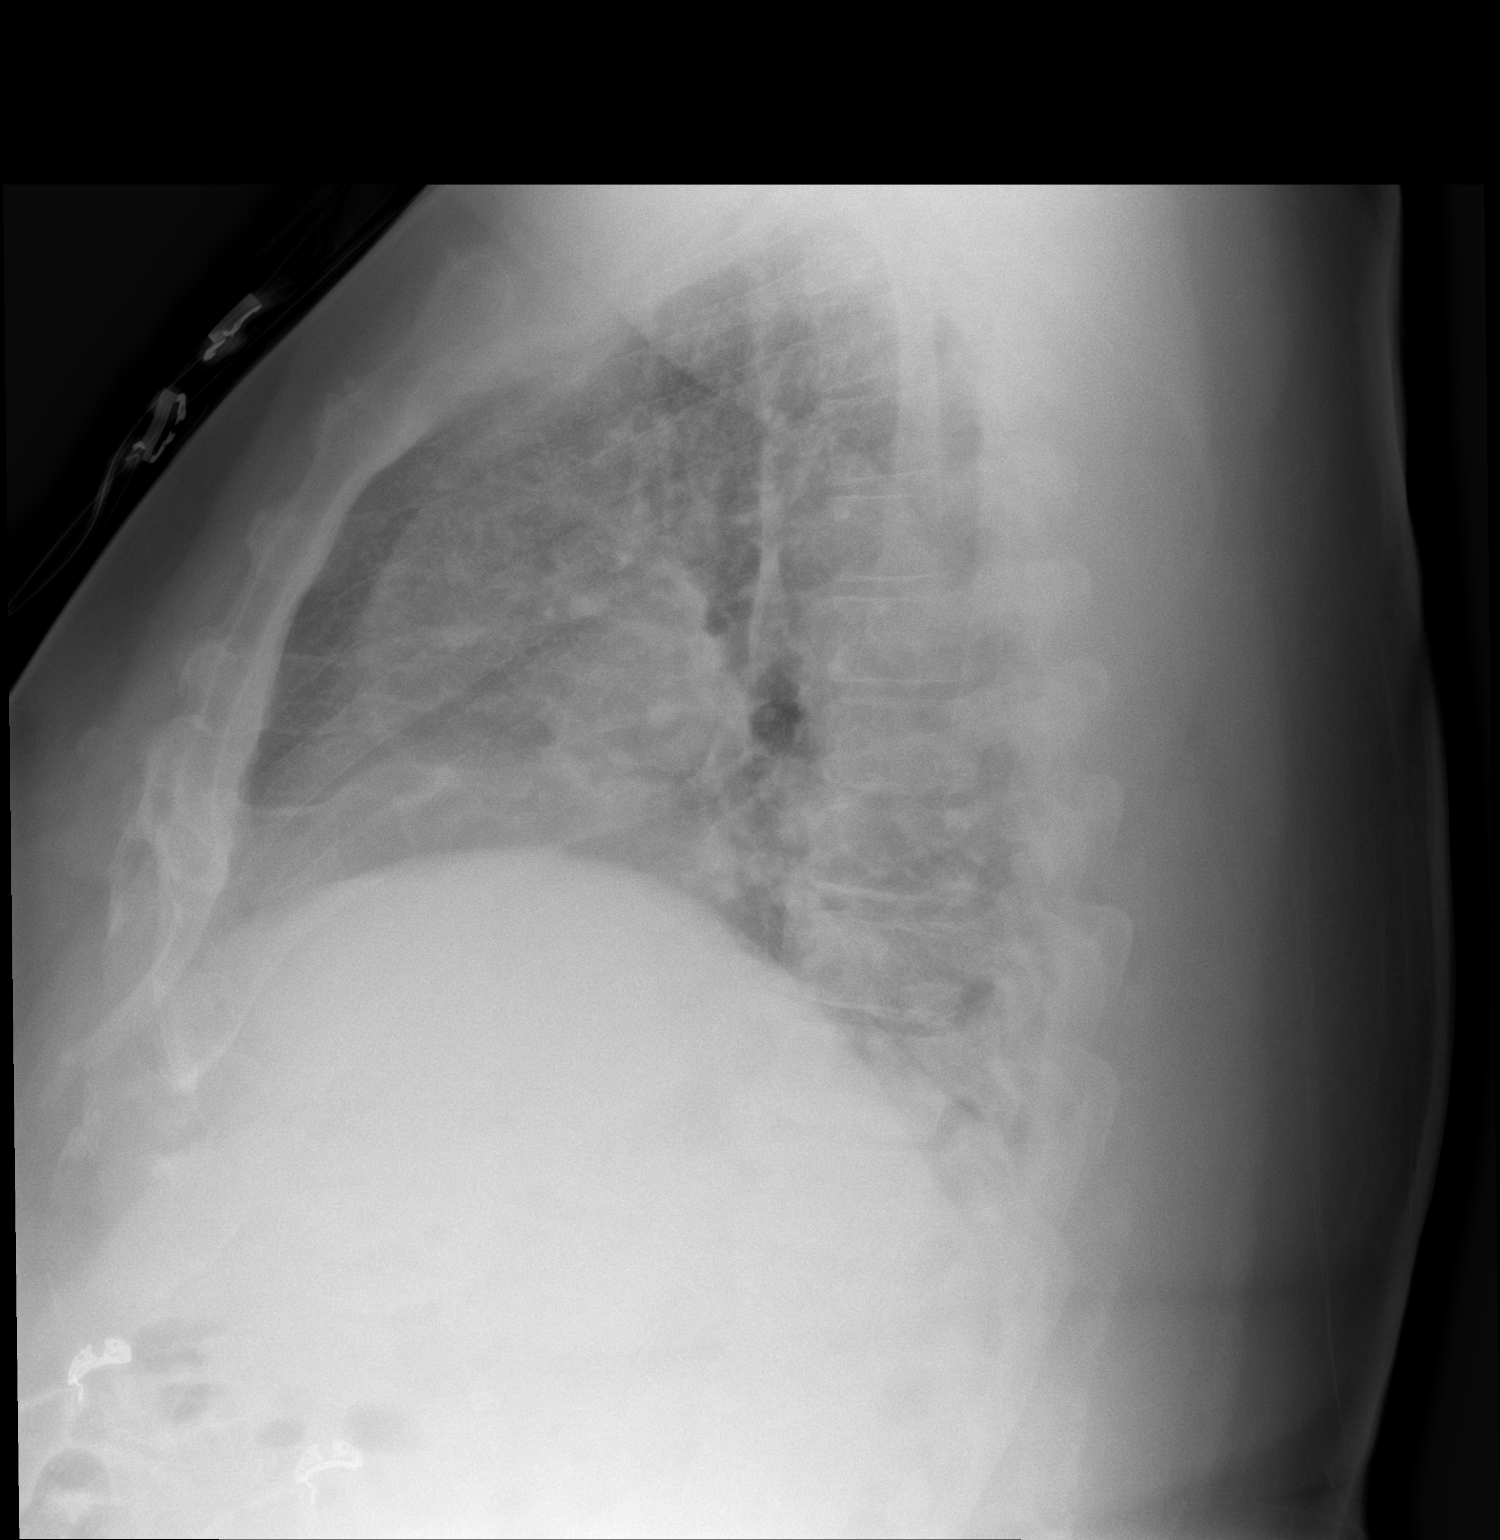

[2 of 2 positions shown; findings below may reference images not displayed]

FINDINGS: Heart size within normal limits. Aortic atherosclerosis.
Peribronchial thickening. No appreciable airspace consolidation or
pulmonary edema. No evidence of pleural effusion or pneumothorax. No
acute bony abnormality identified.
IMPRESSION: Peribronchial thickening, a finding which may be seen in the setting
of viral infection or reactive airway disease/asthma.

No appreciable airspace consolidation.

Aortic Atherosclerosis ([id number]-H19.9).

## 2024-02-05 LAB — COLOGUARD: COLOGUARD: NEGATIVE

## 2024-02-14 ENCOUNTER — Inpatient Hospital Stay

## 2024-02-14 ENCOUNTER — Inpatient Hospital Stay: Attending: Hematology and Oncology | Admitting: Hematology and Oncology

## 2024-02-14 DIAGNOSIS — R7989 Other specified abnormal findings of blood chemistry: Secondary | ICD-10-CM | POA: Insufficient documentation

## 2024-02-14 NOTE — Assessment & Plan Note (Deleted)
 Lab review: 11/08/2023: Ferritin 1147.80, TIBC 364, hemoglobin electrophoresis: Normal, CRP 26, hemoglobin 12.3, MCV 76  Differential diagnosis of elevated ferritin Hemochromatosis Acute phase reactant  Plan: Repeat iron panel Hemochromatosis gene testing  Follow-up in 2 weeks to go over the results

## 2024-03-15 ENCOUNTER — Inpatient Hospital Stay: Attending: Hematology and Oncology | Admitting: Hematology and Oncology

## 2024-03-15 ENCOUNTER — Inpatient Hospital Stay

## 2024-03-15 VITALS — BP 127/66 | HR 67 | Temp 97.7°F | Resp 16 | Wt 311.7 lb

## 2024-03-15 DIAGNOSIS — R7989 Other specified abnormal findings of blood chemistry: Secondary | ICD-10-CM

## 2024-03-15 DIAGNOSIS — F17210 Nicotine dependence, cigarettes, uncomplicated: Secondary | ICD-10-CM | POA: Insufficient documentation

## 2024-03-15 LAB — CBC WITH DIFFERENTIAL (CANCER CENTER ONLY)
Abs Immature Granulocytes: 0.01 K/uL (ref 0.00–0.07)
Basophils Absolute: 0 K/uL (ref 0.0–0.1)
Basophils Relative: 1 %
Eosinophils Absolute: 0 K/uL (ref 0.0–0.5)
Eosinophils Relative: 1 %
HCT: 37.1 % — ABNORMAL LOW (ref 39.0–52.0)
Hemoglobin: 12.3 g/dL — ABNORMAL LOW (ref 13.0–17.0)
Immature Granulocytes: 0 %
Lymphocytes Relative: 41 %
Lymphs Abs: 1.6 K/uL (ref 0.7–4.0)
MCH: 24.4 pg — ABNORMAL LOW (ref 26.0–34.0)
MCHC: 33.2 g/dL (ref 30.0–36.0)
MCV: 73.5 fL — ABNORMAL LOW (ref 80.0–100.0)
Monocytes Absolute: 0.5 K/uL (ref 0.1–1.0)
Monocytes Relative: 12 %
Neutro Abs: 1.7 K/uL (ref 1.7–7.7)
Neutrophils Relative %: 45 %
Platelet Count: 136 K/uL — ABNORMAL LOW (ref 150–400)
RBC: 5.05 MIL/uL (ref 4.22–5.81)
RDW: 16.7 % — ABNORMAL HIGH (ref 11.5–15.5)
Smear Review: NORMAL
WBC Count: 3.8 K/uL — ABNORMAL LOW (ref 4.0–10.5)
nRBC: 0 % (ref 0.0–0.2)

## 2024-03-15 LAB — IRON AND IRON BINDING CAPACITY (CC-WL,HP ONLY)
Iron: 60 ug/dL (ref 45–182)
Saturation Ratios: 23 % (ref 17.9–39.5)
TIBC: 259 ug/dL (ref 250–450)
UIBC: 199 ug/dL (ref 117–376)

## 2024-03-15 LAB — VITAMIN B12: Vitamin B-12: 339 pg/mL (ref 180–914)

## 2024-03-15 LAB — CMP (CANCER CENTER ONLY)
ALT: 21 U/L (ref 0–44)
AST: 30 U/L (ref 15–41)
Albumin: 3.8 g/dL (ref 3.5–5.0)
Alkaline Phosphatase: 59 U/L (ref 38–126)
Anion gap: 2 — ABNORMAL LOW (ref 5–15)
BUN: 12 mg/dL (ref 8–23)
CO2: 30 mmol/L (ref 22–32)
Calcium: 9.4 mg/dL (ref 8.9–10.3)
Chloride: 103 mmol/L (ref 98–111)
Creatinine: 1.01 mg/dL (ref 0.61–1.24)
GFR, Estimated: >60 mL/min (ref >60)
Glucose, Bld: 89 mg/dL (ref 70–99)
Potassium: 4.7 mmol/L (ref 3.5–5.1)
Sodium: 135 mmol/L (ref 135–145)
Total Bilirubin: 0.6 mg/dL (ref 0.0–1.2)
Total Protein: 8.6 g/dL — ABNORMAL HIGH (ref 6.5–8.1)

## 2024-03-15 LAB — FERRITIN: Ferritin: 773 ng/mL — ABNORMAL HIGH (ref 24–336)

## 2024-03-15 LAB — FOLATE: Folate: 8.4 ng/mL (ref >5.9)

## 2024-03-15 NOTE — Progress Notes (Signed)
 [facility] CONSULT NOTE  Patient Care Team: Frank Allean HERO, PA-C as PCP - General (Family Medicine) Frank Lonni BIRCH, MD as PCP - Cardiology (Cardiology)  CHIEF COMPLAINTS/PURPOSE OF CONSULTATION:  Consult for elevated ferritin  HISTORY OF PRESENTING ILLNESS:   [name] is a 63 year old African-American gentleman who was referred to us  for elevated ferritin.  It appears that about 6 months ago he had a blood test showing a markedly elevated ferritin of over 1000.  He has not had this test repeated since.  He was referred to us  for further evaluation.  Patient did not know why he was coming here except that he thought he was coming here for iron deficiency.  He does not report any blood in the stool.   I reviewed her records extensively and collaborated the history with the patient.   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 > 20 yr.   Retained orthopedic hardware 09/2016   right foot    SURGICAL HISTORY: Past Surgical History:  Procedure Laterality Date   CLOSED REDUCTION FOOT DISLOCATION Left    FOOT ARTHRODESIS Right 10/29/2016   Procedure: Right subtalar arthrodesis;  Surgeon: Kit Rush, MD;  Location: [facility];  Service: Orthopedics;  Laterality: Right;   FOOT HARDWARE REMOVAL Right 2017   FOOT SURGERY Right    5-6 x   HARDWARE REMOVAL Right 10/29/2016   Procedure: Right ankle removal of deep implant;  Surgeon: Kit Rush, MD;  Location: [facility];  Service: Orthopedics;  Laterality: Right;   NOSE SURGERY     x 3   ORIF FOOT FRACTURE Right 1990    SOCIAL HISTORY: Social History   Socioeconomic  History   Marital status: Married    Spouse name: Not on file   Number of children: Not on file   Years of education: Not on file   Highest education level: Not on file  Occupational History   Not on file  Tobacco Use   Smoking status: Every Day    Current packs/day: 1.25    Average packs/day: 1.3 packs/day for 52.0 years (65.0 ttl pk-yrs)    Types: Cigarettes   Smokeless tobacco: Never  Vaping Use   Vaping status: Never Used  Substance and Sexual Activity   Alcohol use: Not Currently    Comment: occasionally   Drug use: No   Sexual activity: Not on file  Other Topics Concern   Not on file  Social History Narrative   Not on file   Social Drivers of Health   Financial Resource Strain: Patient Declined (06/30/2023)   Received from [facility]   Overall Financial Resource Strain (CARDIA)    Difficulty of Paying Living Expenses: Patient declined  Food Insecurity: Patient Declined (06/30/2023)   Received from [facility]   Hunger Vital Sign    Within the past 12 months, you worried that your food would run out before you got the money to buy more.: Patient declined    Within the past 12 months, the food you bought just didn't last and you didn't  have money to get more.: Patient declined  Transportation Needs: Patient Declined (06/30/2023)   Received from [facility] - Transportation    Lack of Transportation (Medical): Patient declined    Lack of Transportation (Non-Medical): Patient declined  Physical Activity: Unknown (02/12/2023)   Received from [facility]   Exercise Vital Sign    On average, how many days per week do you engage in moderate to strenuous exercise (like a brisk walk)?: 7 days    On average, how many minutes do you engage in exercise at this level?: Patient declined  Stress: Patient Declined (02/12/2023)   Received from [facility] - Occupational Stress Questionnaire    Feeling of Stress : Patient  declined  Social Connections: Socially Isolated (02/12/2023)   Received from [facility]   Social Network    How would you rate your social network (family, work, friends)?: Little participation, lonely and socially isolated  Intimate Partner Violence: Not At Risk (02/12/2023)   Received from [facility]   HITS    Over the last 12 months how often did your partner physically hurt you?: Never    Over the last 12 months how often did your partner insult you or talk down to you?: Never    Over the last 12 months how often did your partner threaten you with physical harm?: Never    Over the last 12 months how often did your partner scream or curse at you?: Never    FAMILY HISTORY: Family History  Problem Relation Age of Onset   CVA Mother     ALLERGIES:  is allergic to allopurinol .  MEDICATIONS:  Current Outpatient Medications  Medication Sig Dispense Refill   acetaminophen  (TYLENOL ) 500 MG tablet Take 1 tablet (500 mg total) by mouth every 6 (six) hours as needed. 30 tablet 0   albuterol  (PROVENTIL  HFA;VENTOLIN  HFA) 108 (90 Base) MCG/ACT inhaler Inhale 2 puffs into the lungs every 6 (six) hours as needed for wheezing or shortness of breath.      albuterol  (PROVENTIL ) (2.5 MG/3ML) 0.083% nebulizer solution Take 2.5 mg by nebulization 3 (three) times daily as needed for wheezing or shortness of breath.     albuterol  (VENTOLIN  HFA) 108 (90 Base) MCG/ACT inhaler TAKE 2 PUFFS BY MOUTH EVERY 6 HOURS AS NEEDED FOR WHEEZE OR SHORTNESS OF BREATH 18 each 2   allopurinol  (ZYLOPRIM ) 100 MG tablet Take 100 mg by mouth daily.     aspirin  EC 81 MG tablet Take 81 mg by mouth daily.     budesonide-formoterol (SYMBICORT) 160-4.5 MCG/ACT inhaler Inhale 2 puffs into the lungs 2 (two) times daily.     cetirizine (ZYRTEC) 10 MG tablet Take 10 mg by mouth daily.     diclofenac (VOLTAREN) 75 MG EC tablet Take 75 mg by mouth 2 (two) times daily.     docusate sodium  (COLACE) 100 MG capsule Take 1 capsule (100  mg total) by mouth 2 (two) times daily. While taking narcotic pain medicine. 30 capsule 0   Fluticasone-Umeclidin-Vilant (TRELEGY ELLIPTA ) 100-62.5-25 MCG/ACT AEPB Inhale 1 puff into the lungs daily. 60 each 3   HYDROcodone -acetaminophen  (NORCO) 10-325 MG tablet Take 1 tablet by mouth every 6 (six) hours as needed (for pain).     lidocaine  (LIDODERM ) 5 % Place 1 patch onto the skin daily. Remove & Discard patch within 12 hours or as directed by MD 30 patch 0   lisinopril (PRINIVIL,ZESTRIL) 10 MG tablet Take 10  mg by mouth daily.      loperamide (IMODIUM) 2 MG capsule Take 2 mg by mouth 4 (four) times daily as needed for diarrhea or loose stools.     metoprolol  tartrate (LOPRESSOR ) 25 MG tablet Take 1 tablet (25 mg total) by mouth 2 (two) times daily. 60 tablet 11   naloxone (NARCAN) nasal spray 4 mg/0.1 mL Place 1 spray into the nose daily as needed.     omeprazole (PRILOSEC) 20 MG capsule Take 20 mg by mouth daily.     ondansetron  (ZOFRAN ) 4 MG tablet Take 1 tablet (4 mg total) by mouth every 8 (eight) hours as needed for nausea or vomiting. 12 tablet 0   predniSONE  (DELTASONE ) 10 MG tablet Take 4 tablets (40 mg total) by mouth daily. 16 tablet 0   pregabalin  (LYRICA ) 75 MG capsule Take 75 mg by mouth 3 (three) times daily.     ranitidine (ZANTAC) 150 MG tablet Take 150 mg by mouth daily.     senna (SENOKOT) 8.6 MG TABS tablet Take 2 tablets (17.2 mg total) by mouth 2 (two) times daily. 30 each 0   tadalafil (CIALIS) 10 MG tablet Take 10 mg by mouth daily as needed for erectile dysfunction.     oseltamivir  (TAMIFLU ) 75 MG capsule Take 1 capsule (75 mg total) by mouth every 12 (twelve) hours. (Patient not taking: Reported on 03/15/2024) 10 capsule 0   TRELEGY ELLIPTA  100-62.5-25 MCG/ACT AEPB INHALE 1 PUFF BY MOUTH EVERY DAY 60 each 3   No current facility-administered medications for this visit.    REVIEW OF SYSTEMS:   Constitutional: Denies fevers, chills or abnormal night sweats All other  systems were reviewed with the patient and are negative.  PHYSICAL EXAMINATION: ECOG PERFORMANCE STATUS: 1 - Symptomatic but completely ambulatory  Vitals:   03/15/24 0955  BP: 127/66  Pulse: 67  Resp: 16  Temp: 97.7 F (36.5 C)  SpO2: 96%   Filed Weights   03/15/24 0955  Weight: (!) 311 lb 11.2 oz (141.4 kg)    GENERAL:alert, no distress and comfortable  LABORATORY DATA:  I have reviewed the data as listed Lab Results  Component Value Date   WBC 3.8 (L) 03/15/2024   HGB 12.3 (L) 03/15/2024   HCT 37.1 (L) 03/15/2024   MCV 73.5 (L) 03/15/2024   PLT 136 (L) 03/15/2024   Lab Results  Component Value Date   NA 135 03/15/2024   K 4.7 03/15/2024   CL 103 03/15/2024   CO2 30 03/15/2024    RADIOGRAPHIC STUDIES: I have personally reviewed the radiological reports and agreed with the findings in the report.  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 The patient knows to call the clinic with any problems, questions or concerns.  I personally spent a total of 60 minutes in the care of the patient today including preparing to see the patient, getting/reviewing separately obtained history, performing a medically appropriate exam/evaluation, counseling and educating, placing orders, referring and communicating with other health care professionals, documenting clinical information in the EHR, independently interpreting results, communicating results, and coordinating care.  Viinay K Auriella Wieand, MD 03/15/24

## 2024-03-15 NOTE — Assessment & Plan Note (Signed)
 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

## 2024-03-20 ENCOUNTER — Encounter: Payer: Self-pay | Admitting: Adult Medicine

## 2024-03-20 ENCOUNTER — Ambulatory Visit (INDEPENDENT_AMBULATORY_CARE_PROVIDER_SITE_OTHER): Payer: Self-pay | Admitting: Podiatry

## 2024-03-20 DIAGNOSIS — Z91199 Patient's noncompliance with other medical treatment and regimen due to unspecified reason: Secondary | ICD-10-CM

## 2024-03-20 LAB — HEMOCHROMATOSIS DNA-PCR(C282Y,H63D)

## 2024-03-22 NOTE — Progress Notes (Signed)
Patient did not come in for appointment

## 2024-03-23 ENCOUNTER — Telehealth: Payer: Self-pay | Admitting: *Deleted

## 2024-03-23 NOTE — Telephone Encounter (Signed)
 Received VM from pt upset that no f/u visit has been scheduled to review lab work. High priority message sent to scheduling team.

## 2024-03-27 ENCOUNTER — Inpatient Hospital Stay (HOSPITAL_BASED_OUTPATIENT_CLINIC_OR_DEPARTMENT_OTHER): Admitting: Hematology and Oncology

## 2024-03-27 DIAGNOSIS — R7989 Other specified abnormal findings of blood chemistry: Secondary | ICD-10-CM

## 2024-03-27 NOTE — Progress Notes (Signed)
 HEMATOLOGY-ONCOLOGY TELEPHONE VISIT PROGRESS NOTE  I connected with our patient on 03/27/24 at 10:00 AM EDT by telephone and verified that I am speaking with the correct person using two identifiers.  I discussed the limitations, risks, security and privacy concerns of performing an evaluation and management service by telephone and the availability of in person appointments.  I also discussed with the patient that there may be a patient responsible charge related to this service. The patient expressed understanding and agreed to proceed.   History of Present Illness: Follow-up of elevated ferritin to review the results of blood work  History of Present Illness Frank May is a 63 year old male who presents for follow-up of elevated ferritin levels.  Ferritin levels have been elevated, previously recorded at over 1000, with recent blood work showing a decrease to 773. Iron saturation levels are normal, indicating that the elevated ferritin is not due to excessive iron. Genetic testing for hemochromatosis is negative. He has arthritis and fatty liver disease, which have been considered as possible contributors to inflammation.      REVIEW OF SYSTEMS:   Constitutional: Denies fevers, chills or abnormal weight loss All other systems were reviewed with the patient and are negative. Observations/Objective:     Assessment Plan:  Elevated ferritin Lab review: 07/17/2023: Hemoglobin 12.1, MCV 74.9, platelets 130 03/15/2024: Hemoglobin 12.3, MCV 73.5, RDW 16.7, platelets 136, CMP: Normal, total protein 8.6, ferritin 773, iron saturation 23%, B12 339, hemochromatosis gene testing: Negative   Causes of elevated ferritin: Based upon normal iron saturation and rules out iron overload.  Hemochromatosis gene testing was also negative.  Most likely cause is inflammation (possible causes include arthritis versus NASH)   Cause of microcytosis: Most likely hemoglobinopathy such as thalassemia    Recommendation: Treating underlying inflammation.  No indication for any hematological treatments.  Ferritin is likely to come down gradually as inflammation subsides.  No need of phlebotomy or any other treatments.  Return to clinic on an as-needed basis   I discussed the assessment and treatment plan with the patient. The patient was provided an opportunity to ask questions and all were answered. The patient agreed with the plan and demonstrated an understanding of the instructions. The patient was advised to call back or seek an in-person evaluation if the symptoms worsen or if the condition fails to improve as anticipated.   I provided 20 minutes of non-face-to-face time during this encounter.  This includes time for charting and coordination of care   Naomi MARLA Chad, MD

## 2024-03-27 NOTE — Assessment & Plan Note (Signed)
 Lab review: 07/17/2023: Hemoglobin 12.1, MCV 74.9, platelets 130 03/15/2024: Hemoglobin 12.3, MCV 73.5, RDW 16.7, platelets 136, CMP: Normal, total protein 8.6, ferritin 773, iron saturation 23%, B12 339, hemochromatosis gene testing: Negative   Causes of elevated ferritin: Based upon normal iron saturation and rules out iron overload.  Hemochromatosis gene testing was also negative.  Most likely cause is inflammation (possible causes include arthritis versus NASH)   Cause of microcytosis: Most likely hemoglobinopathy such as thalassemia   Recommendation: Treating underlying inflammation.  No indication for any hematological treatments.  Ferritin is likely to come down gradually as inflammation subsides.  No need of phlebotomy or any other treatments.

## 2024-04-19 ENCOUNTER — Ambulatory Visit (INDEPENDENT_AMBULATORY_CARE_PROVIDER_SITE_OTHER)

## 2024-04-19 ENCOUNTER — Encounter: Payer: Self-pay | Admitting: Podiatry

## 2024-04-19 ENCOUNTER — Ambulatory Visit: Admitting: Podiatry

## 2024-04-19 DIAGNOSIS — B351 Tinea unguium: Secondary | ICD-10-CM

## 2024-04-19 DIAGNOSIS — M722 Plantar fascial fibromatosis: Secondary | ICD-10-CM | POA: Diagnosis not present

## 2024-04-19 DIAGNOSIS — M79675 Pain in left toe(s): Secondary | ICD-10-CM | POA: Diagnosis not present

## 2024-04-19 DIAGNOSIS — M778 Other enthesopathies, not elsewhere classified: Secondary | ICD-10-CM | POA: Diagnosis not present

## 2024-04-19 DIAGNOSIS — M19071 Primary osteoarthritis, right ankle and foot: Secondary | ICD-10-CM

## 2024-04-19 DIAGNOSIS — M79674 Pain in right toe(s): Secondary | ICD-10-CM

## 2024-04-19 DIAGNOSIS — M7751 Other enthesopathy of right foot: Secondary | ICD-10-CM | POA: Diagnosis not present

## 2024-04-19 MED ORDER — TRIAMCINOLONE ACETONIDE 10 MG/ML IJ SUSP
10.0000 mg | Freq: Once | INTRAMUSCULAR | Status: AC
  Administered 2024-04-19: 10 mg via INTRA_ARTICULAR

## 2024-04-20 NOTE — Progress Notes (Signed)
 Subjective:   Patient ID: Frank May, male   DOB: 63 y.o.   MRN: [id number]   HPI Patient presents with a lot of pain in the right heel and also history of fusion of the right subtalar joint ankle that is giving him a lot of problems.  States that he also has nail disease that he cannot take care of like to have that help for him    ROS      Objective:  Physical Exam  Neurovascular status intact with patient found to have quite a bit of swelling around the ankle with a history of fusion approximate 10 years ago with the screws replaced approximate 5 years ago.  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  I am referring to another physician for evaluation of subtalar joint ankle  X-rays indicate screws are in place difficult to ascertain without further imaging the condition of the subtalar joint ankle right

## 2024-04-23 ENCOUNTER — Encounter (HOSPITAL_BASED_OUTPATIENT_CLINIC_OR_DEPARTMENT_OTHER): Payer: Self-pay | Admitting: Emergency Medicine

## 2024-04-23 ENCOUNTER — Emergency Department (HOSPITAL_BASED_OUTPATIENT_CLINIC_OR_DEPARTMENT_OTHER)
Admission: EM | Admit: 2024-04-23 | Discharge: 2024-04-23 | Disposition: A | Discharge status: Home / Self Care | Attending: Emergency Medicine | Admitting: Emergency Medicine

## 2024-04-23 ENCOUNTER — Other Ambulatory Visit: Payer: Self-pay

## 2024-04-23 ENCOUNTER — Emergency Department (HOSPITAL_BASED_OUTPATIENT_CLINIC_OR_DEPARTMENT_OTHER)

## 2024-04-23 DIAGNOSIS — Z79899 Other long term (current) drug therapy: Secondary | ICD-10-CM | POA: Insufficient documentation

## 2024-04-23 DIAGNOSIS — R030 Elevated blood-pressure reading, without diagnosis of hypertension: Secondary | ICD-10-CM

## 2024-04-23 DIAGNOSIS — M79601 Pain in right arm: Secondary | ICD-10-CM | POA: Insufficient documentation

## 2024-04-23 DIAGNOSIS — M19021 Primary osteoarthritis, right elbow: Secondary | ICD-10-CM | POA: Diagnosis not present

## 2024-04-23 DIAGNOSIS — M19011 Primary osteoarthritis, right shoulder: Secondary | ICD-10-CM | POA: Diagnosis not present

## 2024-04-23 DIAGNOSIS — Z7982 Long term (current) use of aspirin: Secondary | ICD-10-CM | POA: Insufficient documentation

## 2024-04-23 DIAGNOSIS — I10 Essential (primary) hypertension: Secondary | ICD-10-CM | POA: Insufficient documentation

## 2024-04-23 DIAGNOSIS — M25511 Pain in right shoulder: Secondary | ICD-10-CM | POA: Diagnosis present

## 2024-04-23 MED ORDER — IBUPROFEN 400 MG PO TABS
400.0000 mg | ORAL_TABLET | Freq: Once | ORAL | Status: AC
  Administered 2024-04-23: 400 mg via ORAL
  Filled 2024-04-23: qty 1

## 2024-04-23 MED ORDER — METHOCARBAMOL 750 MG PO TABS
750.0000 mg | ORAL_TABLET | Freq: Three times a day (TID) | ORAL | 0 refills | Status: AC | PRN
Start: 2024-04-23 — End: ?

## 2024-04-23 NOTE — ED Provider Notes (Signed)
 [place] EMERGENCY DEPARTMENT AT [facility] Provider Note   CSN: [id number] Arrival date & time: 04/23/24  9290     Patient presents with: Arm Pain   Frank May is a 63 y.o. male.   Pt c/o pain to right shoulder and elbow area in past two weeks. States initially he felt as if maybe he slept wrong on it. Dull pain, worse w certain movements of left shoulder and left elbow. Denies radicular type pain or any midline neck or upper back pain. No arm swelling. No skin changes, erythema or rash. No numbness or weakness in arm. No loss of normal function of arm/hand. 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  No chest pain or discomfort. No exertional pain or discomfort. No sob or unusual doe. Pt notes hx arthritis.   The history is provided by the patient and medical records.  Arm Pain Pertinent negatives include no chest pain, no abdominal pain, no headaches and no shortness of breath.       Prior to Admission medications   Medication Sig Start Date End Date Taking? Authorizing Provider  methocarbamol  (ROBAXIN ) 750 MG tablet Take 1 tablet (750 mg total) by mouth 3 (three) times daily as needed (muscle spasm/pain). 04/23/24  Yes Bernard Drivers, MD  acetaminophen  (TYLENOL ) 500 MG tablet Take 1 tablet (500 mg total) by mouth every 6 (six) hours as needed. 07/17/23   Charlyn Sora, MD  albuterol  (PROVENTIL  HFA;VENTOLIN  HFA) 108 (90 Base) MCG/ACT inhaler Inhale 2 puffs into the lungs every 6 (six) hours as needed for wheezing or shortness of breath.     [provider]  albuterol  (PROVENTIL ) (2.5 MG/3ML) 0.083% nebulizer solution Take 2.5 mg by nebulization 3 (three) times daily as needed for wheezing or shortness of breath.    [provider]  albuterol  (VENTOLIN  HFA) 108 (90 Base) MCG/ACT inhaler TAKE 2 PUFFS BY MOUTH EVERY 6 HOURS AS NEEDED FOR WHEEZE OR SHORTNESS OF BREATH 04/12/23    Icard, Adine L, DO  allopurinol  (ZYLOPRIM ) 100 MG tablet Take 100 mg by mouth daily.    [provider]  aspirin  EC 81 MG tablet Take 81 mg by mouth daily.    [provider]  budesonide-formoterol (SYMBICORT) 160-4.5 MCG/ACT inhaler Inhale 2 puffs into the lungs 2 (two) times daily. 07/28/18 04/19/24  [provider]  cetirizine (ZYRTEC) 10 MG tablet Take 10 mg by mouth daily.    [provider]  diclofenac (VOLTAREN) 75 MG EC tablet Take 75 mg by mouth 2 (two) times daily.    [provider]  docusate sodium  (COLACE) 100 MG capsule Take 1 capsule (100 mg total) by mouth 2 (two) times daily. While taking narcotic pain medicine. 10/29/16   Aniceto Eva Grebe, PA-C  Fluticasone-Umeclidin-Vilant (TRELEGY ELLIPTA ) 100-62.5-25 MCG/ACT AEPB Inhale 1 puff into the lungs daily. 05/07/21   Brenna Adine CROME, DO  HYDROcodone -acetaminophen  (NORCO) 10-325 MG tablet Take 1 tablet by mouth every 6 (six) hours as needed (for pain).    [provider]  lidocaine  (LIDODERM ) 5 % Place 1 patch onto the skin daily. Remove & Discard patch within 12 hours or as directed by MD 06/18/23   Silver Fell A, PA  lisinopril (PRINIVIL,ZESTRIL) 10 MG tablet Take 10 mg by mouth daily.     [provider]  loperamide (IMODIUM) 2 MG capsule Take 2 mg by mouth 4 (four) times daily as needed for diarrhea or  loose stools.    [provider]  metoprolol  tartrate (LOPRESSOR ) 25 MG tablet Take 1 tablet (25 mg total) by mouth 2 (two) times daily. 08/10/18   Verlin Lonni BIRCH, MD  naloxone [facility]) nasal spray 4 mg/0.1 mL Place 1 spray into the nose daily as needed.    [provider]  omeprazole (PRILOSEC) 20 MG capsule Take 20 mg by mouth daily. 07/28/18   [provider]  ondansetron  (ZOFRAN ) 4 MG tablet Take 1 tablet (4 mg total) by mouth every 8 (eight) hours as needed for nausea or vomiting. 07/17/23   Charlyn Sora, MD  oseltamivir   (TAMIFLU ) 75 MG capsule Take 1 capsule (75 mg total) by mouth every 12 (twelve) hours. 07/17/23   Charlyn Sora, MD  predniSONE  (DELTASONE ) 10 MG tablet Take 4 tablets (40 mg total) by mouth daily. 07/18/23   Charlyn Sora, MD  pregabalin  (LYRICA ) 75 MG capsule Take 75 mg by mouth 3 (three) times daily. 07/11/18   [provider]  ranitidine (ZANTAC) 150 MG tablet Take 150 mg by mouth daily.    [provider]  senna (SENOKOT) 8.6 MG TABS tablet Take 2 tablets (17.2 mg total) by mouth 2 (two) times daily. 10/29/16   Aniceto Eva Grebe, PA-C  tadalafil (CIALIS) 10 MG tablet Take 10 mg by mouth daily as needed for erectile dysfunction.    [provider]  TRELEGY ELLIPTA  100-62.5-25 MCG/ACT AEPB INHALE 1 PUFF BY MOUTH EVERY DAY 03/13/23   Icard, Adine CROME, DO    Allergies: Allopurinol     Review of Systems  Constitutional:  Negative for chills, diaphoresis and fever.  Respiratory:  Negative for shortness of breath.   Cardiovascular:  Negative for chest pain.  Gastrointestinal:  Negative for abdominal pain.  Musculoskeletal:  Negative for back pain and neck pain.  Skin:  Negative for rash and wound.  Neurological:  Negative for weakness, numbness and headaches.    Updated Vital Signs BP (!) 163/95 (BP Location: Left Arm)   Pulse 69   Temp 98.9 F (37.2 C) (Oral)   Resp 16   SpO2 99%   Physical Exam Vitals and nursing note reviewed.  Constitutional:      Appearance: Normal appearance. He is well-developed.  HENT:     Head: Atraumatic.  Eyes:     General: No scleral icterus.    Conjunctiva/sclera: Conjunctivae normal.  Neck:     Trachea: No tracheal deviation.  Cardiovascular:     Rate and Rhythm: Normal rate and regular rhythm.     Pulses: Normal pulses.     Heart sounds: Normal heart sounds. No murmur heard.    No friction rub. No gallop.  Pulmonary:     Effort: Pulmonary effort is normal. No accessory muscle usage or respiratory distress.      Breath sounds: Normal breath sounds.  Abdominal:     General: There is no distension.     Palpations: Abdomen is soft.     Tenderness: There is no abdominal tenderness.  Musculoskeletal:        General: No swelling.     Cervical back: Normal range of motion and neck supple. No rigidity or tenderness.     Comments: C/T spine non tender, aligned, no step off, no pain w rom. Good passive rom right shoulder, elbow and wrist without pain. RUE is of normal color and warmth, and is without any swelling. Radial pulse 2+. No focal bony tenderness. With certain movements of arm/shoulder, such as abduction shoulder,  active flexion at elbow, pt notes recurrence of symptoms/pain.   Skin:    General: Skin is warm and dry.     Findings: No rash.  Neurological:     Mental Status: He is alert.     Comments: Alert, speech clear. RUE NVI, with intact motor/sens fxn, stre 5/5, rad/med/uln fxn, motor and sens, intact.   Psychiatric:        Mood and Affect: Mood normal.     (all labs ordered are listed, but only abnormal results are displayed) Labs Reviewed - No data to display  EKG: None  Radiology: DG Elbow Complete Right Result Date: 04/23/2024 EXAM: 3 VIEW(S) XRAY OF THE RIGHT ELBOW COMPARISON: None available. CLINICAL HISTORY: 63 year old male with pain. FINDINGS: BONES AND JOINTS: No acute fracture. No focal osseous lesion. No joint dislocation. Generalized mild degenerative osteophytosis about the right elbow with joint space narrowing. No joint effusion. SOFT TISSUES: The soft tissues are unremarkable. IMPRESSION: 1. No acute fracture or dislocation identified about the right elbow. Electronically signed by: Helayne Hurst MD 04/23/2024 08:19 AM EST RP Workstation: [id number]   DG Shoulder Right Result Date: 04/23/2024 EXAM: 1 VIEW(S) XRAY OF THE _LATERALITY_ SHOULDER 04/23/2024 08:06:57 AM COMPARISON: None available. CLINICAL HISTORY: pain FINDINGS: BONES AND JOINTS: Mild inferior glenohumeral joint  space narrowing. Moderate acromioclavicular joint space narrowing and peripheral osteophytosis. Mild greater tuberosity hyperostosis. No acute fracture or dislocation. SOFT TISSUES: No abnormal calcifications. Visualized lung is unremarkable. IMPRESSION: 1. No acute abnormality. 2. AC joint osteoarthritis. 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 Radiology: ordered and independent interpretation performed. Decision-making details documented in ED Course.  Risk Prescription drug management.   Imaging ordered.   Reviewed nursing notes and prior charts for additional history.   Ibuprofen  po. Po fluids.  Xrays reviewed/interpreted by me - degen changes, no acute process.   Pt appears stable for ED d/c.   Rec pcp f/u.       Final diagnoses:  Musculoskeletal pain of right upper extremity  Elevated blood pressure reading  Essential hypertension  Arthritis of right shoulder  Arthritis of right elbow    ED Discharge Orders          Ordered    methocarbamol  (ROBAXIN ) 750 MG tablet  3 times daily PRN        04/23/24 9178               Kiyanna Biegler, MD 04/23/24 [phone]

## 2024-04-23 NOTE — ED Triage Notes (Signed)
 Pt caox4 ambulatory NAD c/o R arm pain x3 wks. Pt denies any recent trauma or injury, stating he just woke up to it one morning and it has been hurting ever since. Took oxy and gabapentin this morn approx 2-3 hrs ago.

## 2024-04-23 NOTE — Discharge Instructions (Addendum)
 It was our pleasure to provide your ER care today - we hope that you feel better.  Take acetaminophen  or ibuprofen  as need for pain. You may also take robaxin  as need for muscle pain/spasm - no driving when taking.   Follow up with primary care doctor in 1-2 weeks if symptoms fail to improve/resolve. Also follow up with primary care doctor regarding your blood pressure that is high today.  Return to ER if worse, new symptoms, fevers, arm swelling or redness, new/severe pain, numbness/weakness, trouble breathing, or other emergency concern.

## 2024-05-02 ENCOUNTER — Other Ambulatory Visit (HOSPITAL_COMMUNITY): Payer: Self-pay | Admitting: Adult Medicine

## 2024-05-02 DIAGNOSIS — Z122 Encounter for screening for malignant neoplasm of respiratory organs: Secondary | ICD-10-CM

## 2024-05-02 DIAGNOSIS — F17200 Nicotine dependence, unspecified, uncomplicated: Secondary | ICD-10-CM

## 2024-05-12 ENCOUNTER — Ambulatory Visit (HOSPITAL_COMMUNITY)
Admission: RE | Admit: 2024-05-12 | Discharge: 2024-05-12 | Disposition: A | Source: Ambulatory Visit | Attending: Adult Medicine | Admitting: Adult Medicine

## 2024-05-12 DIAGNOSIS — Z122 Encounter for screening for malignant neoplasm of respiratory organs: Secondary | ICD-10-CM

## 2024-05-12 DIAGNOSIS — F17200 Nicotine dependence, unspecified, uncomplicated: Secondary | ICD-10-CM

## 2024-05-15 ENCOUNTER — Encounter

## 2024-06-02 ENCOUNTER — Encounter (HOSPITAL_BASED_OUTPATIENT_CLINIC_OR_DEPARTMENT_OTHER): Payer: Self-pay | Admitting: Emergency Medicine

## 2024-06-02 ENCOUNTER — Emergency Department (HOSPITAL_BASED_OUTPATIENT_CLINIC_OR_DEPARTMENT_OTHER): Admitting: Radiology

## 2024-06-02 ENCOUNTER — Other Ambulatory Visit: Payer: Self-pay

## 2024-06-02 ENCOUNTER — Emergency Department (HOSPITAL_BASED_OUTPATIENT_CLINIC_OR_DEPARTMENT_OTHER)
Admission: EM | Admit: 2024-06-02 | Discharge: 2024-06-02 | Discharge status: Against Medical Advice | Attending: Emergency Medicine | Admitting: Emergency Medicine

## 2024-06-02 DIAGNOSIS — Z5321 Procedure and treatment not carried out due to patient leaving prior to being seen by health care provider: Secondary | ICD-10-CM | POA: Insufficient documentation

## 2024-06-02 DIAGNOSIS — J4489 Other specified chronic obstructive pulmonary disease: Secondary | ICD-10-CM | POA: Insufficient documentation

## 2024-06-02 DIAGNOSIS — R059 Cough, unspecified: Secondary | ICD-10-CM | POA: Insufficient documentation

## 2024-06-02 DIAGNOSIS — R0602 Shortness of breath: Secondary | ICD-10-CM | POA: Diagnosis present

## 2024-06-02 LAB — CBC
HCT: 38.9 % — ABNORMAL LOW (ref 39.0–52.0)
Hemoglobin: 12.7 g/dL — ABNORMAL LOW (ref 13.0–17.0)
MCH: 24.7 pg — ABNORMAL LOW (ref 26.0–34.0)
MCHC: 32.6 g/dL (ref 30.0–36.0)
MCV: 75.7 fL — ABNORMAL LOW (ref 80.0–100.0)
Platelets: 150 K/uL (ref 150–400)
RBC: 5.14 MIL/uL (ref 4.22–5.81)
RDW: 17.4 % — ABNORMAL HIGH (ref 11.5–15.5)
WBC: 4.4 K/uL (ref 4.0–10.5)
nRBC: 0 % (ref 0.0–0.2)

## 2024-06-02 LAB — BASIC METABOLIC PANEL WITH GFR
Anion gap: 7 (ref 5–15)
BUN: 12 mg/dL (ref 8–23)
CO2: 25 mmol/L (ref 22–32)
Calcium: 9.3 mg/dL (ref 8.9–10.3)
Chloride: 102 mmol/L (ref 98–111)
Creatinine, Ser: 0.94 mg/dL (ref 0.61–1.24)
GFR, Estimated: >60 mL/min
Glucose, Bld: 85 mg/dL (ref 70–99)
Potassium: 4.4 mmol/L (ref 3.5–5.1)
Sodium: 135 mmol/L (ref 135–145)

## 2024-06-02 NOTE — ED Triage Notes (Signed)
 Patient c/o sob and cough x 4 days with productive cough.  Patient has a hx of asthma and COPD.  Patient also has a history of pna.

## 2024-06-02 NOTE — ED Notes (Signed)
 Patient called RN out to lobby wanting IV removed because he wanted to leave.  Patient states 5 hours is too damn long to wait.  Patient assured that we were cleaning a room for him and he was next to come back.  Patient still insisting on leaving.  Patient made aware of risks of leaving.  IV removed without incident, patient left with steady gait.

## 2024-06-13 NOTE — Progress Notes (Unsigned)
 "  New Patient Pulmonology Office Visit   Subjective:  Patient ID: Frank May, male    DOB: 08-28-1960  MRN: [id number]  Referred by: Arby Lyle DELENA, NP  CC: No chief complaint on file.   HPI Frank May is a 64 y.o. male with ***   Symptoms Associated with Lung cancer:   {Central Tumor Sx:33645}  {Peripheral Tumor Sx:33646}  {Sx of Metastasis:33647}  {Conditions associated with lung cancer & imp to identify prior to bronch:33648}  {STOPBANG:33649}  {Hx of Anesthesia reactions:33650}  PMH:   Important Medications:   Allergies:   Social History:  {Smoking and Bi[employer] Exposure:33651}  {Occupational Exposures:33652}  {Military Specific Exposures:33653}  Family History: {Cancer-related QY:66345}  ASA grade:  {ASA GRADE:110003}  Karnofsky Performance Status: {Karnofsky Performance Status:33655}  ECOG Performance Status: {findings; ecog performance status:31780}   {PULM QUESTIONNAIRES (Optional):33196}  ROS  Allergies: Allopurinol  Current Medications[1] Past Medical History:  Diagnosis Date   A-fib (HCC)    Arthritis    right knee, right hip, right shoulder   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 > 20 yr.   Retained orthopedic hardware 09/2016   right foot   Past Surgical History:  Procedure Laterality Date   CLOSED REDUCTION FOOT DISLOCATION Left    FOOT ARTHRODESIS Right 10/29/2016   Procedure: Right subtalar arthrodesis;  Surgeon: Kit Rush, MD;  Location: [facility];  Service: Orthopedics;  Laterality: Right;   FOOT HARDWARE REMOVAL Right 2017   FOOT SURGERY Right    5-6 x   HARDWARE REMOVAL Right 10/29/2016   Procedure: Right ankle removal of deep implant;  Surgeon: Kit Rush, MD;  Location: [facility];   Service: Orthopedics;  Laterality: Right;   NOSE SURGERY     x 3   ORIF FOOT FRACTURE Right 1990   Family History  Problem Relation Age of Onset   CVA Mother    Social History   Socioeconomic History   Marital status: Married    Spouse name: Not on file   Number of children: Not on file   Years of education: Not on file   Highest education level: Not on file  Occupational History   Not on file  Tobacco Use   Smoking status: Every Day    Current packs/day: 1.25    Average packs/day: 1.3 packs/day for 52.0 years (65.0 ttl pk-yrs)    Types: Cigarettes   Smokeless tobacco: Never  Vaping Use   Vaping status: Never Used  Substance and Sexual Activity   Alcohol use: Not Currently    Comment: occasionally   Drug use: No   Sexual activity: Not on file  Other Topics Concern   Not on file  Social History Narrative   Not on file   Social Drivers of Health   Tobacco Use: High Risk (06/03/2024)   Received from [facility]   Patient History    Smoking Tobacco Use: Every Day    Smokeless Tobacco Use: Former    Passive Exposure: Current  Physicist, Medical Strain: Low Risk (05/29/2024)   Received from [facility]   Overall Financial Resource Strain (CARDIA)    How hard is it for you to pay for the very basics like food, housing, medical care,  and heating?: Not hard at all  Food Insecurity: No Food Insecurity (05/29/2024)   Received from [facility]   Epic    Within the past 12 months, you worried that your food would run out before you got the money to buy more.: Never true    Within the past 12 months, the food you bought just didn't last and you didn't have money to get more.: Never true  Transportation Needs: Unmet Transportation Needs (05/29/2024)   Received from [facility]    In the past 12 months, has lack of transportation kept you from medical appointments or from getting medications?: Yes    In the past 12 months, has lack of transportation kept you  from meetings, work, or from getting things needed for daily living?: No  Physical Activity: Inactive (05/29/2024)   Received from [facility]   Exercise Vital Sign    On average, how many days per week do you engage in moderate to strenuous exercise (like a brisk walk)?: 0 days    Minutes of Exercise per Session: Not on file  Stress: No Stress Concern Present (05/29/2024)   Received from [facility] - Occupational Stress Questionnaire    Do you feel stress - tense, restless, nervous, or anxious, or unable to sleep at night because your mind is troubled all the time - these days?: Not at all  Social Connections: Socially Integrated (05/29/2024)   Received from [facility]   Social Network    How would you rate your social network (family, work, friends)?: Good participation with social networks  Intimate Partner Violence: Not At Risk (05/29/2024)   Received from [facility]   HITS    Over the last 12 months how often did your partner physically hurt you?: Never    Over the last 12 months how often did your partner insult you or talk down to you?: Never    Over the last 12 months how often did your partner threaten you with physical harm?: Never    Over the last 12 months how often did your partner scream or curse at you?: Never  Depression (PHQ2-9): Low Risk (09/01/2021)   Depression (PHQ2-9)    PHQ-2 Score: 0  Alcohol Screen: Not on file  Housing: High Risk (05/29/2024)   Received from [facility]    In the last 12 months, was there a time when you were not able to pay the mortgage or rent on time?: Yes    In the past 12 months, how many times have you moved where you were living?: 0    At any time in the past 12 months, were you homeless or living in a shelter (including now)?: No  Utilities: Not At Risk (05/29/2024)   Received from [facility]    In the past 12 months has the electric, gas, oil, or water company  threatened to shut off services in your home?: No  Health Literacy: Not on file       Objective:  There were no vitals taken for this visit. {Pulm Vitals (Optional):32837}  Physical Exam  Diagnostic Review:  {Labs (Optional):32838}  LDCT 05/12/2024: IMPRESSION: 1. New soft tissue fullness and obstruction of the left lower lobe bronchus, just beyond the takeoff of the superior segmental bronchus. Increased collapse/consolidation in the left lower lobe. Findings are worrisome for a centrally obstructing mass. Lung-RADS 4B, suspicious. Additional imaging evaluation or consultation  with Pulmonology or Thoracic Surgery and PET are recommended. These results will be called to the ordering clinician or representative by the Radiologist Assistant, and communication documented in the PACS or [employer]. 2. Trace left pleural effusion. 3.  Aortic atherosclerosis (ICD10-I70.0). 4. Enlarged pulmonic trunk, indicative of pulmonary arterial hypertension. 5.  Emphysema (ICD10-J43.9).  PET/CT 05/16/2021: IMPRESSION: No findings suspicious for malignancy.   Chronic scarring in the medial left lower lobe, likely post infectious/inflammatory.    Assessment & Plan:   Assessment & Plan   No orders of the defined types were placed in this encounter.     No follow-ups on file.   Prima Rayner, MD    [1]  Current Outpatient Medications:    acetaminophen  (TYLENOL ) 500 MG tablet, Take 1 tablet (500 mg total) by mouth every 6 (six) hours as needed., Disp: 30 tablet, Rfl: 0   albuterol  (PROVENTIL  HFA;VENTOLIN  HFA) 108 (90 Base) MCG/ACT inhaler, Inhale 2 puffs into the lungs every 6 (six) hours as needed for wheezing or shortness of breath. , Disp: , Rfl:    albuterol  (PROVENTIL ) (2.5 MG/3ML) 0.083% nebulizer solution, Take 2.5 mg by nebulization 3 (three) times daily as needed for wheezing or shortness of breath., Disp: , Rfl:    albuterol  (VENTOLIN  HFA) 108 (90 Base) MCG/ACT  inhaler, TAKE 2 PUFFS BY MOUTH EVERY 6 HOURS AS NEEDED FOR WHEEZE OR SHORTNESS OF BREATH, Disp: 18 each, Rfl: 2   allopurinol  (ZYLOPRIM ) 100 MG tablet, Take 100 mg by mouth daily., Disp: , Rfl:    aspirin  EC 81 MG tablet, Take 81 mg by mouth daily., Disp: , Rfl:    budesonide-formoterol (SYMBICORT) 160-4.5 MCG/ACT inhaler, Inhale 2 puffs into the lungs 2 (two) times daily., Disp: , Rfl:    cetirizine (ZYRTEC) 10 MG tablet, Take 10 mg by mouth daily., Disp: , Rfl:    diclofenac (VOLTAREN) 75 MG EC tablet, Take 75 mg by mouth 2 (two) times daily., Disp: , Rfl:    docusate sodium  (COLACE) 100 MG capsule, Take 1 capsule (100 mg total) by mouth 2 (two) times daily. While taking narcotic pain medicine., Disp: 30 capsule, Rfl: 0   Fluticasone-Umeclidin-Vilant (TRELEGY ELLIPTA ) 100-62.5-25 MCG/ACT AEPB, Inhale 1 puff into the lungs daily., Disp: 60 each, Rfl: 3   HYDROcodone -acetaminophen  (NORCO) 10-325 MG tablet, Take 1 tablet by mouth every 6 (six) hours as needed (for pain)., Disp: , Rfl:    lidocaine  (LIDODERM ) 5 %, Place 1 patch onto the skin daily. Remove & Discard patch within 12 hours or as directed by MD, Disp: 30 patch, Rfl: 0   lisinopril (PRINIVIL,ZESTRIL) 10 MG tablet, Take 10 mg by mouth daily. , Disp: , Rfl:    loperamide (IMODIUM) 2 MG capsule, Take 2 mg by mouth 4 (four) times daily as needed for diarrhea or loose stools., Disp: , Rfl:    methocarbamol  (ROBAXIN ) 750 MG tablet, Take 1 tablet (750 mg total) by mouth 3 (three) times daily as needed (muscle spasm/pain)., Disp: 15 tablet, Rfl: 0   metoprolol  tartrate (LOPRESSOR ) 25 MG tablet, Take 1 tablet (25 mg total) by mouth 2 (two) times daily., Disp: 60 tablet, Rfl: 11   naloxone (NARCAN) nasal spray 4 mg/0.1 mL, Place 1 spray into the nose daily as needed., Disp: , Rfl:    omeprazole (PRILOSEC) 20 MG capsule, Take 20 mg by mouth daily., Disp: , Rfl:    ondansetron  (ZOFRAN ) 4 MG tablet, Take 1 tablet (4 mg total) by mouth every 8 (eight)  hours  as needed for nausea or vomiting., Disp: 12 tablet, Rfl: 0   oseltamivir  (TAMIFLU ) 75 MG capsule, Take 1 capsule (75 mg total) by mouth every 12 (twelve) hours., Disp: 10 capsule, Rfl: 0   predniSONE  (DELTASONE ) 10 MG tablet, Take 4 tablets (40 mg total) by mouth daily., Disp: 16 tablet, Rfl: 0   pregabalin  (LYRICA ) 75 MG capsule, Take 75 mg by mouth 3 (three) times daily., Disp: , Rfl:    ranitidine (ZANTAC) 150 MG tablet, Take 150 mg by mouth daily., Disp: , Rfl:    senna (SENOKOT) 8.6 MG TABS tablet, Take 2 tablets (17.2 mg total) by mouth 2 (two) times daily., Disp: 30 each, Rfl: 0   tadalafil (CIALIS) 10 MG tablet, Take 10 mg by mouth daily as needed for erectile dysfunction., Disp: , Rfl:    TRELEGY ELLIPTA  100-62.5-25 MCG/ACT AEPB, INHALE 1 PUFF BY MOUTH EVERY DAY, Disp: 60 each, Rfl: 3  "

## 2024-06-14 ENCOUNTER — Ambulatory Visit (HOSPITAL_BASED_OUTPATIENT_CLINIC_OR_DEPARTMENT_OTHER): Admitting: Pulmonary Disease

## 2024-06-28 ENCOUNTER — Emergency Department (HOSPITAL_BASED_OUTPATIENT_CLINIC_OR_DEPARTMENT_OTHER): Admitting: Radiology

## 2024-06-28 ENCOUNTER — Emergency Department (HOSPITAL_BASED_OUTPATIENT_CLINIC_OR_DEPARTMENT_OTHER)

## 2024-06-28 ENCOUNTER — Emergency Department (HOSPITAL_BASED_OUTPATIENT_CLINIC_OR_DEPARTMENT_OTHER)
Admission: EM | Admit: 2024-06-28 | Discharge: 2024-06-28 | Disposition: A | Discharge status: Home / Self Care | Attending: Emergency Medicine | Admitting: Emergency Medicine

## 2024-06-28 ENCOUNTER — Other Ambulatory Visit: Payer: Self-pay

## 2024-06-28 DIAGNOSIS — J449 Chronic obstructive pulmonary disease, unspecified: Secondary | ICD-10-CM | POA: Diagnosis not present

## 2024-06-28 DIAGNOSIS — I110 Hypertensive heart disease with heart failure: Secondary | ICD-10-CM | POA: Insufficient documentation

## 2024-06-28 DIAGNOSIS — Z7951 Long term (current) use of inhaled steroids: Secondary | ICD-10-CM | POA: Diagnosis not present

## 2024-06-28 DIAGNOSIS — S4991XA Unspecified injury of right shoulder and upper arm, initial encounter: Secondary | ICD-10-CM | POA: Diagnosis present

## 2024-06-28 DIAGNOSIS — X501XXA Overexertion from prolonged static or awkward postures, initial encounter: Secondary | ICD-10-CM | POA: Diagnosis not present

## 2024-06-28 DIAGNOSIS — Z7982 Long term (current) use of aspirin: Secondary | ICD-10-CM | POA: Diagnosis not present

## 2024-06-28 DIAGNOSIS — Z7952 Long term (current) use of systemic steroids: Secondary | ICD-10-CM | POA: Diagnosis not present

## 2024-06-28 DIAGNOSIS — I509 Heart failure, unspecified: Secondary | ICD-10-CM | POA: Diagnosis not present

## 2024-06-28 NOTE — ED Triage Notes (Signed)
 Reports R upper arm pain after turning off valve

## 2024-06-28 NOTE — Discharge Instructions (Signed)
 It was a pleasure taking care of you today.  As discussed, your CT scan did not show any abnormalities.  I have included the number of the orthopedic surgeon.  Call tomorrow to schedule an appointment for further evaluation.  Continue to ice and elevate your arm.  You may take over-the-counter ibuprofen  or Tylenol  as needed for pain.  Return to the ER for new or worsening symptoms.

## 2024-06-28 NOTE — ED Notes (Signed)
 DC paperwork given and verbally understood.

## 2024-06-28 NOTE — ED Provider Notes (Signed)
 " [facility] Provider Note   CSN: [id number] Arrival date & time: 06/28/24  1209     Patient presents with: Arm Injury   Frank May is a 64 y.o. male with a past medical history significant for CHF, hypertension, COPD, and A-fib who presents to the ED due to right upper extremity pain.  Patient notes he was turning off a water valve and heard a pop in his right upper extremity located around his biceps.  Since the injury he has been unable to lift his right upper extremity. Right hand dominant. Denies weakness in grip strength.  No neck pain or injury.  Denies numbness/tingling.  History obtained from patient and past medical records. No interpreter used during encounter.       Prior to Admission medications  Medication Sig Start Date End Date Taking? Authorizing Provider  gabapentin (NEURONTIN) 600 MG tablet Take by mouth. 04/16/24  Yes [provider]  oxyCODONE -acetaminophen  (PERCOCET) 10-325 MG tablet Take 0.5-1 tablets by mouth as directed. *1-3 times daily as needed* 06/09/24  Yes [provider]  acetaminophen  (TYLENOL ) 500 MG tablet Take 1 tablet (500 mg total) by mouth every 6 (six) hours as needed. 07/17/23   Charlyn Sora, MD  albuterol  (PROVENTIL  HFA;VENTOLIN  HFA) 108 (90 Base) MCG/ACT inhaler Inhale 2 puffs into the lungs every 6 (six) hours as needed for wheezing or shortness of breath.     [provider]  albuterol  (PROVENTIL ) (2.5 MG/3ML) 0.083% nebulizer solution Take 2.5 mg by nebulization 3 (three) times daily as needed for wheezing or shortness of breath.    [provider]  albuterol  (VENTOLIN  HFA) 108 (90 Base) MCG/ACT inhaler TAKE 2 PUFFS BY MOUTH EVERY 6 HOURS AS NEEDED FOR WHEEZE OR SHORTNESS OF BREATH 04/12/23   Icard, Adine L, DO  allopurinol  (ZYLOPRIM ) 100 MG tablet Take 100 mg by mouth daily.    [provider]  aspirin  EC 81 MG tablet Take 81 mg by mouth daily.     [provider]  budesonide-formoterol (SYMBICORT) 160-4.5 MCG/ACT inhaler Inhale 2 puffs into the lungs 2 (two) times daily. 07/28/18 04/19/24  [provider]  cetirizine (ZYRTEC) 10 MG tablet Take 10 mg by mouth daily.    [provider]  diclofenac (VOLTAREN) 75 MG EC tablet Take 75 mg by mouth 2 (two) times daily.    [provider]  docusate sodium  (COLACE) 100 MG capsule Take 1 capsule (100 mg total) by mouth 2 (two) times daily. While taking narcotic pain medicine. 10/29/16   Aniceto Eva Grebe, PA-C  Fluticasone-Umeclidin-Vilant (TRELEGY ELLIPTA ) 100-62.5-25 MCG/ACT AEPB Inhale 1 puff into the lungs daily. 05/07/21   Brenna Adine CROME, DO  HYDROcodone -acetaminophen  (NORCO) 10-325 MG tablet Take 1 tablet by mouth every 6 (six) hours as needed (for pain).    [provider]  lidocaine  (LIDODERM ) 5 % Place 1 patch onto the skin daily. Remove & Discard patch within 12 hours or as directed by MD 06/18/23   Silver Fell A, PA  lisinopril (PRINIVIL,ZESTRIL) 10 MG tablet Take 10 mg by mouth daily.     [provider]  loperamide (IMODIUM) 2 MG capsule Take 2 mg by mouth 4 (four) times daily as needed for diarrhea or loose stools.    [provider]  methocarbamol  (ROBAXIN ) 750 MG tablet Take 1 tablet (750 mg total) by mouth 3 (three) times daily as needed (muscle spasm/pain). 04/23/24   Steinl, Kevin, MD  metoprolol  tartrate (LOPRESSOR )  25 MG tablet Take 1 tablet (25 mg total) by mouth 2 (two) times daily. 08/10/18   Verlin Lonni BIRCH, MD  naloxone [facility]) nasal spray 4 mg/0.1 mL Place 1 spray into the nose daily as needed.    [provider]  omeprazole (PRILOSEC) 20 MG capsule Take 20 mg by mouth daily. 07/28/18   [provider]  ondansetron  (ZOFRAN ) 4 MG tablet Take 1 tablet (4 mg total) by mouth every 8 (eight) hours as needed for nausea or vomiting. 07/17/23   Charlyn Sora, MD  oseltamivir  (TAMIFLU ) 75 MG  capsule Take 1 capsule (75 mg total) by mouth every 12 (twelve) hours. 07/17/23   Charlyn Sora, MD  predniSONE  (DELTASONE ) 10 MG tablet Take 4 tablets (40 mg total) by mouth daily. 07/18/23   Charlyn Sora, MD  pregabalin  (LYRICA ) 75 MG capsule Take 75 mg by mouth 3 (three) times daily. 07/11/18   [provider]  ranitidine (ZANTAC) 150 MG tablet Take 150 mg by mouth daily.    [provider]  senna (SENOKOT) 8.6 MG TABS tablet Take 2 tablets (17.2 mg total) by mouth 2 (two) times daily. 10/29/16   Aniceto Eva Grebe, PA-C  tadalafil (CIALIS) 10 MG tablet Take 10 mg by mouth daily as needed for erectile dysfunction.    [provider]  TRELEGY ELLIPTA  100-62.5-25 MCG/ACT AEPB INHALE 1 PUFF BY MOUTH EVERY DAY 03/13/23   Icard, Adine CROME, DO    Allergies: Allopurinol     Review of Systems  Musculoskeletal:  Positive for arthralgias.    Updated Vital Signs BP (!) 147/81 (BP Location: Right Arm)   Pulse 67   Temp 98 F (36.7 C)   Resp 16   SpO2 97%   Physical Exam Vitals and nursing note reviewed.  Constitutional:      General: He is not in acute distress.    Appearance: He is not ill-appearing.  HENT:     Head: Normocephalic.  Eyes:     Pupils: Pupils are equal, round, and reactive to light.  Neck:     Comments: No cervical midline tenderness.  Cardiovascular:     Rate and Rhythm: Normal rate and regular rhythm.     Pulses: Normal pulses.     Heart sounds: Normal heart sounds. No murmur heard.    No friction rub. No gallop.  Pulmonary:     Effort: Pulmonary effort is normal.     Breath sounds: Normal breath sounds.  Abdominal:     General: Abdomen is flat. There is no distension.     Palpations: Abdomen is soft.     Tenderness: There is no abdominal tenderness. There is no guarding or rebound.  Musculoskeletal:        General: Normal range of motion.     Cervical back: Neck supple.     Comments: No tenderness throughout right shoulder.  Tenderness throughout right biceps; however no deformity noted. Unable to lift arm up. Good grip strength. Radial pulse intact.   Skin:    General: Skin is warm and dry.  Neurological:     General: No focal deficit present.     Mental Status: He is alert.  Psychiatric:        Mood and Affect: Mood normal.        Behavior: Behavior normal.     (all labs ordered are listed, but only abnormal results are displayed) Labs Reviewed - No data to display  EKG: None  Radiology: CT HUMERUS RIGHT WO CONTRAST  Result Date: 06/28/2024 CLINICAL DATA:  Right upper arm pain after turning on a valve. Possible biceps tendon rupture. Abnormal x-ray with proximal humeral bone lesion. EXAM: CT OF THE UPPER RIGHT EXTREMITY WITHOUT CONTRAST TECHNIQUE: Multidetector CT imaging of the right shoulder and right upper arm was performed according to the standard protocol. RADIATION DOSE REDUCTION: This exam was performed according to the departmental dose-optimization program which includes automated exposure control, adjustment of the mA and/or kV according to patient size and/or use of iterative reconstruction technique. COMPARISON:  Right shoulder radiographs 06/28/2024 and 04/23/2024. PET-CT 05/16/2021. FINDINGS: Technical note: Despite efforts by the technologist and patient, mild motion artifact is present on today's exam and could not be eliminated. This reduces exam sensitivity and specificity. Bones/Joint/Cartilage No evidence of acute fracture, dislocation or humeral head osteonecrosis. No aggressive osseous lesions are identified by CT. Specifically, no concerning abnormality is identified laterally in the proximal right humeral metaphysis to correspond with the questioned radiographic finding. The marrow density is within physiologic limits, and no cortical thickening or periosteal reaction is identified. There are moderate acromioclavicular and glenohumeral degenerative changes. Mild degenerative changes at the  right elbow. Ligaments Suboptimally assessed by CT. Muscles and Tendons The right upper arm and shoulder muscles appear unremarkable as evaluated by CT. The biceps tendon is suboptimally evaluated by CT. Soft tissues No evidence of soft tissue mass, organized fluid collection, foreign body or soft tissue emphysema. IMPRESSION: 1. No acute osseous findings or suspicious osseous lesions identified in the right shoulder or upper arm by CT. There was no abnormal metabolic activity in the proximal right humerus on previous PET-CT. If the patient has persistent unexplained shoulder pain, recommend further evaluation with MRI to better evaluate the bone marrow and biceps tendon. 2. Moderate acromioclavicular and glenohumeral degenerative changes. 3. No evidence of soft tissue mass, organized fluid collection or soft tissue emphysema. Electronically Signed   By: Elsie Perone M.D.   On: 06/28/2024 16:10   CT Shoulder Right Wo Contrast Result Date: 06/28/2024 CLINICAL DATA:  Right upper arm pain after turning on a valve. Possible biceps tendon rupture. Abnormal x-ray with proximal humeral bone lesion. EXAM: CT OF THE UPPER RIGHT EXTREMITY WITHOUT CONTRAST TECHNIQUE: Multidetector CT imaging of the right shoulder and right upper arm was performed according to the standard protocol. RADIATION DOSE REDUCTION: This exam was performed according to the departmental dose-optimization program which includes automated exposure control, adjustment of the mA and/or kV according to patient size and/or use of iterative reconstruction technique. COMPARISON:  Right shoulder radiographs 06/28/2024 and 04/23/2024. PET-CT 05/16/2021. FINDINGS: Technical note: Despite efforts by the technologist and patient, mild motion artifact is present on today's exam and could not be eliminated. This reduces exam sensitivity and specificity. Bones/Joint/Cartilage No evidence of acute fracture, dislocation or humeral head osteonecrosis. No aggressive  osseous lesions are identified by CT. Specifically, no concerning abnormality is identified laterally in the proximal right humeral metaphysis to correspond with the questioned radiographic finding. The marrow density is within physiologic limits, and no cortical thickening or periosteal reaction is identified. There are moderate acromioclavicular and glenohumeral degenerative changes. Mild degenerative changes at the right elbow. Ligaments Suboptimally assessed by CT. Muscles and Tendons The right upper arm and shoulder muscles appear unremarkable as evaluated by CT. The biceps tendon is suboptimally evaluated by CT. Soft tissues No evidence of soft tissue mass, organized fluid collection, foreign body or soft tissue emphysema. IMPRESSION: 1. No acute osseous findings or suspicious osseous lesions identified in the right  shoulder or upper arm by CT. There was no abnormal metabolic activity in the proximal right humerus on previous PET-CT. If the patient has persistent unexplained shoulder pain, recommend further evaluation with MRI to better evaluate the bone marrow and biceps tendon. 2. Moderate acromioclavicular and glenohumeral degenerative changes. 3. No evidence of soft tissue mass, organized fluid collection or soft tissue emphysema. Electronically Signed   By: Elsie Perone M.D.   On: 06/28/2024 16:10   DG Humerus Right Result Date: 06/28/2024 EXAM: 1 VIEW(S) XRAY OF THE RIGHT HUMERUS 06/28/2024 01:11:53 PM COMPARISON: 04/23/2024. CLINICAL HISTORY: Injury, possibly bicep tendon rupture. FINDINGS: BONES AND JOINTS: No acute fracture or subluxation. Degenerative changes at the ac joint. Nonspecific lucencies are present in the proximal right humerus. SOFT TISSUES: Unremarkable. IMPRESSION: 1. Nonspecific lucent lesions in the proximal right humerus, with differential considerations including metastatic disease, multiple myeloma, benign cystic change (intraosseous ganglion/geode), and less likely  infection. 2. Recommend MRI of the proximal humerus/shoulder for further characterization and to assess for occult fracture and associated soft-tissue injury; if MRI is contraindicated, CT is an alternative. Electronically signed by: Waddell Calk MD 06/28/2024 01:54 PM EST RP Workstation: [id number]     Procedures   Medications Ordered in the ED - No data to display                                  Medical Decision Making Amount and/or Complexity of Data Reviewed Radiology: ordered and independent interpretation performed. Decision-making details documented in ED Course.   This patient presents to the ED for concern of RUE injury, this involves an extensive number of treatment options, and is a complaint that carries with it a high risk of complications and morbidity.  The differential diagnosis includes bony fracture, dislocation, tendon rupture, etc  64 year old male presents to the ED due to right upper extremity pain.  Notes he was turning off a water valve and heard a pop around his biceps and has had pain since.  Patient is right-hand dominant.  Upon arrival, stable vitals.  Patient well-appearing on exam.  Does have tenderness throughout his biceps of his right upper extremity.  No tenderness throughout shoulder or elbow.  Right upper extremity neurovascularly intact with soft compartments.  Low suspicion for compartment syndrome.  No noticeable deformity.  X-ray ordered.  Concern for possible bicep tendon rupture?  Patient has good grip strength on exam.  No cervical midline tenderness.  Low suspicion for central cord compression.  X-ray personally reviewed and interpreted which demonstrates some lesions of unknown etiology.  Could possibly be multiple myeloma, metastatic cancer, cystic changes and less likely infection.  Unfortunately, there is no MRI available at this location.  CT ordered.  CT negative for any acute osseous findings.  Unclear what were the lesions on x-ray  however, no evidence of lesions on CT scan.  Patient placed in sling for comfort.  Orthopedics number given to patient at discharge and advised to call to schedule an appointment if symptoms do not improve.  Patient stable for discharge. Strict ED precautions discussed with patient. Patient states understanding and agrees to plan. Patient discharged home in no acute distress and stable vitals  Elderly >65 Hx COPD Lives independently   Discussed with Dr. Dean who agrees with assessment and plan.     Final diagnoses:  Arm injury, right, initial encounter    ED Discharge Orders     None  Lorelle Aleck [place], [place] 06/28/24 1644  "

## 2024-07-18 ENCOUNTER — Ambulatory Visit (HOSPITAL_BASED_OUTPATIENT_CLINIC_OR_DEPARTMENT_OTHER): Admitting: Pulmonary Disease
